# Patient Record
Sex: Female | Born: 1940
Health system: Southern US, Community
[De-identification: ages and names within clinical notes are randomized; demographics above are authoritative.]

## PROBLEM LIST (undated history)

## (undated) DIAGNOSIS — E079 Disorder of thyroid, unspecified: Secondary | ICD-10-CM

## (undated) HISTORY — PX: CATARACT EXTRACTION, BILATERAL: SHX1313

---

## 2001-12-24 ENCOUNTER — Other Ambulatory Visit: Admission: RE | Admit: 2001-12-24 | Discharge: 2001-12-24 | Payer: Self-pay | Admitting: Obstetrics and Gynecology

## 2003-01-07 ENCOUNTER — Encounter: Payer: Self-pay | Admitting: Emergency Medicine

## 2003-01-07 ENCOUNTER — Emergency Department (HOSPITAL_COMMUNITY): Admission: AC | Admit: 2003-01-07 | Discharge: 2003-01-07 | Payer: Self-pay

## 2005-08-11 ENCOUNTER — Encounter: Admission: RE | Admit: 2005-08-11 | Discharge: 2005-08-11 | Payer: Self-pay | Admitting: Neurology

## 2005-12-18 ENCOUNTER — Ambulatory Visit (HOSPITAL_COMMUNITY): Admission: RE | Admit: 2005-12-18 | Discharge: 2005-12-18 | Payer: Self-pay | Admitting: Emergency Medicine

## 2006-01-23 ENCOUNTER — Ambulatory Visit (HOSPITAL_COMMUNITY): Admission: RE | Admit: 2006-01-23 | Discharge: 2006-01-23 | Payer: Self-pay | Admitting: Neurology

## 2009-04-26 ENCOUNTER — Encounter: Admission: RE | Admit: 2009-04-26 | Discharge: 2009-04-26 | Payer: Self-pay | Admitting: Endocrinology

## 2011-07-31 ENCOUNTER — Other Ambulatory Visit (HOSPITAL_COMMUNITY): Payer: Self-pay | Admitting: Endocrinology

## 2011-07-31 DIAGNOSIS — Z1231 Encounter for screening mammogram for malignant neoplasm of breast: Secondary | ICD-10-CM

## 2011-08-28 ENCOUNTER — Ambulatory Visit (HOSPITAL_COMMUNITY)
Admission: RE | Admit: 2011-08-28 | Discharge: 2011-08-28 | Disposition: A | Discharge status: Home / Self Care | Payer: Medicare Other | Source: Ambulatory Visit | Attending: Endocrinology | Admitting: Endocrinology

## 2011-08-28 DIAGNOSIS — Z1231 Encounter for screening mammogram for malignant neoplasm of breast: Secondary | ICD-10-CM | POA: Insufficient documentation

## 2013-12-01 ENCOUNTER — Ambulatory Visit: Payer: Medicare Other | Admitting: Rehabilitative and Restorative Service Providers"

## 2013-12-01 ENCOUNTER — Ambulatory Visit: Payer: Medicare PPO | Attending: Otolaryngology | Admitting: Physical Therapy

## 2013-12-01 DIAGNOSIS — IMO0001 Reserved for inherently not codable concepts without codable children: Secondary | ICD-10-CM | POA: Insufficient documentation

## 2013-12-01 DIAGNOSIS — R269 Unspecified abnormalities of gait and mobility: Secondary | ICD-10-CM | POA: Insufficient documentation

## 2013-12-01 DIAGNOSIS — H8110 Benign paroxysmal vertigo, unspecified ear: Secondary | ICD-10-CM | POA: Diagnosis not present

## 2014-08-04 ENCOUNTER — Emergency Department (HOSPITAL_COMMUNITY)
Admission: EM | Admit: 2014-08-04 | Discharge: 2014-08-04 | Disposition: A | Discharge status: Home / Self Care | Payer: Medicare PPO | Attending: Emergency Medicine | Admitting: Emergency Medicine

## 2014-08-04 ENCOUNTER — Encounter (HOSPITAL_COMMUNITY): Payer: Self-pay | Admitting: Physical Medicine and Rehabilitation

## 2014-08-04 ENCOUNTER — Emergency Department (HOSPITAL_COMMUNITY): Payer: Medicare PPO

## 2014-08-04 DIAGNOSIS — S199XXA Unspecified injury of neck, initial encounter: Secondary | ICD-10-CM | POA: Insufficient documentation

## 2014-08-04 DIAGNOSIS — Y9301 Activity, walking, marching and hiking: Secondary | ICD-10-CM | POA: Insufficient documentation

## 2014-08-04 DIAGNOSIS — Y9289 Other specified places as the place of occurrence of the external cause: Secondary | ICD-10-CM | POA: Diagnosis not present

## 2014-08-04 DIAGNOSIS — Y998 Other external cause status: Secondary | ICD-10-CM | POA: Diagnosis not present

## 2014-08-04 DIAGNOSIS — S59901A Unspecified injury of right elbow, initial encounter: Secondary | ICD-10-CM | POA: Diagnosis present

## 2014-08-04 DIAGNOSIS — M25561 Pain in right knee: Secondary | ICD-10-CM

## 2014-08-04 DIAGNOSIS — W1839XA Other fall on same level, initial encounter: Secondary | ICD-10-CM | POA: Diagnosis not present

## 2014-08-04 DIAGNOSIS — S80211A Abrasion, right knee, initial encounter: Secondary | ICD-10-CM | POA: Insufficient documentation

## 2014-08-04 DIAGNOSIS — Z23 Encounter for immunization: Secondary | ICD-10-CM | POA: Insufficient documentation

## 2014-08-04 DIAGNOSIS — W19XXXA Unspecified fall, initial encounter: Secondary | ICD-10-CM

## 2014-08-04 DIAGNOSIS — S50311A Abrasion of right elbow, initial encounter: Secondary | ICD-10-CM | POA: Insufficient documentation

## 2014-08-04 DIAGNOSIS — M25521 Pain in right elbow: Secondary | ICD-10-CM

## 2014-08-04 MED ORDER — ASPIRIN 81 MG PO CHEW
324.0000 mg | CHEWABLE_TABLET | Freq: Once | ORAL | Status: AC
  Administered 2014-08-04: 324 mg via ORAL
  Filled 2014-08-04: qty 4

## 2014-08-04 MED ORDER — TETANUS-DIPHTH-ACELL PERTUSSIS 5-2.5-18.5 LF-MCG/0.5 IM SUSP
0.5000 mL | Freq: Once | INTRAMUSCULAR | Status: AC
  Administered 2014-08-04: 0.5 mL via INTRAMUSCULAR
  Filled 2014-08-04: qty 0.5

## 2014-08-04 NOTE — Discharge Instructions (Signed)
Dressing Change °A dressing is a material placed over wounds. It keeps the wound clean, dry, and protected from further injury. This provides an environment that favors wound healing.  °BEFORE YOU BEGIN °· Get your supplies together. Things you may need include: °· Saline solution. °· Flexible gauze dressing. °· Medicated cream. °· Tape. °· Gloves. °· Abdominal dressing pads. °· Gauze squares. °· Plastic bags. °· Take pain medicine 30 minutes before the dressing change if you need it. °· Take a shower before you do the first dressing change of the day. Use plastic wrap or a plastic bag to prevent the dressing from getting wet. °REMOVING YOUR OLD DRESSING  °· Wash your hands with soap and water. Dry your hands with a clean towel. °· Put on your gloves. °· Remove any tape. °· Carefully remove the old dressing. If the dressing sticks, you may dampen it with warm water to loosen it, or follow your caregiver's specific directions. °· Remove any gauze or packing tape that is in your wound. °· Take off your gloves. °· Put the gloves, tape, gauze, or any packing tape into a plastic bag. °CHANGING YOUR DRESSING °· Open the supplies. °· Take the cap off the saline solution. °· Open the gauze package so that the gauze remains on the inside of the package. °· Put on your gloves. °· Clean your wound as told by your caregiver. °· If you have been told to keep your wound dry, follow those instructions. °· Your caregiver may tell you to do one or more of the following: °· Pick up the gauze. Pour the saline solution over the gauze. Squeeze out the extra saline solution. °· Put medicated cream or other medicine on your wound if you have been told to do so. °· Put the solution soaked gauze only in your wound, not on the skin around it. °· Pack your wound loosely or as told by your caregiver. °· Put dry gauze on your wound. °· Put abdominal dressing pads over the dry gauze if your wet gauze soaks through. °· Tape the abdominal dressing  pads in place so they will not fall off. Do not wrap the tape completely around the affected part (arm, leg, abdomen). °· Wrap the dressing pads with a flexible gauze dressing to secure it in place. °· Take off your gloves. Put them in the plastic bag with the old dressing. Tie the bag shut and throw it away. °· Keep the dressing clean and dry until your next dressing change. °· Wash your hands. °SEEK MEDICAL CARE IF: °· Your skin around the wound looks red. °· Your wound feels more tender or sore. °· You see pus in the wound. °· Your wound smells bad. °· You have a fever. °· Your skin around the wound has a rash that itches and burns. °· You see black or yellow skin in your wound that was not there before. °· You feel nauseous, throw up, and feel very tired. °Document Released: 07/31/2004 Document Revised: 09/15/2011 Document Reviewed: 05/05/2011 °ExitCare® Patient Information ©2015 [employer]. This information is not intended to replace advice given to you by your health care provider. Make sure you discuss any questions you have with your health care provider. ° ° °Fall Prevention and Home Safety °Falls cause injuries and can affect all age groups. It is possible to use preventive measures to significantly decrease the likelihood of falls. There are many simple measures which can make your home safer and prevent falls. °OUTDOORS °·   Repair cracks and edges of walkways and driveways. °· Remove high doorway thresholds. °· Trim shrubbery on the main path into your home. °· Have good outside lighting. °· Clear walkways of tools, rocks, debris, and clutter. °· Check that handrails are not broken and are securely fastened. Both sides of steps should have handrails. °· Have leaves, snow, and ice cleared regularly. °· Use sand or salt on walkways during winter months. °· In the garage, clean up grease or oil spills. °BATHROOM °· Install night lights. °· Install grab bars by the toilet and in the tub and shower. °· Use  non-skid mats or decals in the tub or shower. °· Place a plastic non-slip stool in the shower to sit on, if needed. °· Keep floors dry and clean up all water on the floor immediately. °· Remove soap buildup in the tub or shower on a regular basis. °· Secure bath mats with non-slip, double-sided rug tape. °· Remove throw rugs and tripping hazards from the floors. °BEDROOMS °· Install night lights. °· Make sure a bedside light is easy to reach. °· Do not use oversized bedding. °· Keep a telephone by your bedside. °· Have a firm chair with side arms to use for getting dressed. °· Remove throw rugs and tripping hazards from the floor. °KITCHEN °· Keep handles on pots and pans turned toward the center of the stove. Use back burners when possible. °· Clean up spills quickly and allow time for drying. °· Avoid walking on wet floors. °· Avoid hot utensils and knives. °· Position shelves so they are not too high or low. °· Place commonly used objects within easy reach. °· If necessary, use a sturdy step stool with a grab bar when reaching. °· Keep electrical cables out of the way. °· Do not use floor polish or wax that makes floors slippery. If you must use wax, use non-skid floor wax. °· Remove throw rugs and tripping hazards from the floor. °STAIRWAYS °· Never leave objects on stairs. °· Place handrails on both sides of stairways and use them. Fix any loose handrails. Make sure handrails on both sides of the stairways are as long as the stairs. °· Check carpeting to make sure it is firmly attached along stairs. Make repairs to worn or loose carpet promptly. °· Avoid placing throw rugs at the top or bottom of stairways, or properly secure the rug with carpet tape to prevent slippage. Get rid of throw rugs, if possible. °· Have an electrician put in a light switch at the top and bottom of the stairs. °OTHER FALL PREVENTION TIPS °· Wear low-heel or rubber-soled shoes that are supportive and fit well. Wear closed toe  shoes. °· When using a stepladder, make sure it is fully opened and both spreaders are firmly locked. Do not climb a closed stepladder. °· Add color or contrast paint or tape to grab bars and handrails in your home. Place contrasting color strips on first and last steps. °· Learn and use mobility aids as needed. Install an electrical emergency response system. °· Turn on lights to avoid dark areas. Replace light bulbs that burn out immediately. Get light switches that glow. °· Arrange furniture to create clear pathways. Keep furniture in the same place. °· Firmly attach carpet with non-skid or double-sided tape. °· Eliminate uneven floor surfaces. °· Select a carpet pattern that does not visually hide the edge of steps. °· Be aware of all pets. °OTHER HOME SAFETY TIPS °· Set the water temperature   for 120° F (48.8° C). °· Keep emergency numbers on or near the telephone. °· Keep smoke detectors on every level of the home and near sleeping areas. °Document Released: 06/13/2002 Document Revised: 12/23/2011 Document Reviewed: 09/12/2011 °ExitCare® Patient Information ©2015 [employer]. This information is not intended to replace advice given to you by your health care provider. Make sure you discuss any questions you have with your health care provider. ° ° °

## 2014-08-04 NOTE — ED Notes (Signed)
Pt reports a fall this morning while walking her dog from the dog pulling her. Pt c/o right neck pain, right elbow, and right leg pain with abrasions on legs. Pt alert and oriented.

## 2014-08-04 NOTE — ED Notes (Signed)
Wait time given 

## 2014-08-04 NOTE — ED Provider Notes (Signed)
  Face-to-face evaluation   History: She fell earlier today when her dog pulled her over as it ran away from her.  She was able to ambulate afterwards.  She complains of pain in neck, right elbow, and frequently.  Physical exam: Alert, elderly female.  No range of motion, arms and legs bilaterally.  She is able sit on the side of the stretcher without problems.  Abrasions of right dorsal elbow, and right anterior knee.  Elbow abrasion is full-thickness for a very small area.  Medical screening examination/treatment/procedure(s) were conducted as a shared visit with non-physician practitioner(s) and myself.  I personally evaluated the patient during the encounter  Flint MelterElliott L Meilyn Heindl, MD 08/05/14 1208

## 2014-08-04 NOTE — ED Notes (Signed)
Pt states she accidentally tripped and fell over dog this morning. Reports R elbow pain/swelling, also states R knee pain. Abrasions noted to R elbow and knee. Denies LOC. Did not hit head. Pt is alert and oriented x4.

## 2014-08-04 NOTE — ED Provider Notes (Signed)
CSN: [id number]     Arrival date & time 08/04/14  1053 History   First MD Initiated Contact with Patient 08/04/14 1522     Chief Complaint  Patient presents with  . Fall   Wanda Long is a 74 y.o. female who presents to the ED after a fall while walking her dog injuring her right elbow and right shoulder. The patient reports her large dog started running away from her and she ran after it, and tripped and fell. She denies hitting her head or loss of consciousness. She reports immediate onset of right knee and right elbow pain. Patient also reports since arrival to the ED she's had gradual onset of right neck pain and low back pain. Patient currently reports her pain as a 6 out of 10 and worse with movement. Patient has taken nothing for treatment today. The patient denies fevers, numbness, tingling, weakness, dizziness, lightheadedness, weakness, chest pain, shortness of breath, or loss of consciousness.   (Consider location/radiation/quality/duration/timing/severity/associated sxs/prior Treatment) HPI  History reviewed. No pertinent past medical history. History reviewed. No pertinent past surgical history. No family history on file. History  Substance Use Topics  . Smoking status: Never Smoker   . Smokeless tobacco: Not on file  . Alcohol Use: No   OB History    No data available     Review of Systems  Constitutional: Negative for fever and chills.  HENT: Negative for congestion, ear pain and sore throat.   Eyes: Negative for pain and visual disturbance.  Respiratory: Negative for cough, shortness of breath and wheezing.   Cardiovascular: Negative for chest pain.  Gastrointestinal: Negative for nausea, vomiting, abdominal pain and diarrhea.  Genitourinary: Negative for dysuria.  Musculoskeletal: Positive for back pain and neck pain.       Right elbow and right knee pain  Skin: Positive for wound. Negative for rash.  Neurological: Negative for dizziness, weakness,  light-headedness and headaches.      Allergies  Review of patient's allergies indicates no known allergies.  Home Medications   Prior to Admission medications   Not on File   BP 105/88 mmHg  Pulse 67  Temp(Src) 98.1 F (36.7 C) (Oral)  Resp 16  SpO2 100% Physical Exam  Constitutional: She is oriented to person, place, and time. She appears well-developed and well-nourished. No distress.  HENT:  Head: Normocephalic and atraumatic.  Mouth/Throat: Oropharynx is clear and moist.  Eyes: Conjunctivae are normal. Pupils are equal, round, and reactive to light. Right eye exhibits no discharge. Left eye exhibits no discharge.  Neck: Normal range of motion. Neck supple.  Neck is non-tender to palpation. No apneas, step-offs or edema noted. Patient able to turn her head greater than 45 in each direction.  Cardiovascular: Normal rate, regular rhythm, normal heart sounds and intact distal pulses.  Exam reveals no gallop and no friction rub.   No murmur heard. Bilateral radial pulses are intact. Bilateral posterior tibialis pulses are intact.  Pulmonary/Chest: Effort normal and breath sounds normal. No respiratory distress. She has no wheezes. She has no rales.  Abdominal: Soft. She exhibits no distension. There is no tenderness.  Musculoskeletal:  Patient has moderate soft tissue swelling to her right anterior knee. There is superficial abrasions. Patient for range of motion of her right knee. Superficial abrasions to her right elbow. Patient is range of motion her right elbow and shoulder. No back tenderness to palpation. No bony tenderness, no crepitus, step-offs or deformities noted.  Lymphadenopathy:  She has no cervical adenopathy.  Neurological: She is alert and oriented to person, place, and time. Coordination normal.  Skin: Skin is warm and dry. No rash noted. She is not diaphoretic. No erythema. No pallor.  Superficial abrasions to her right anterior knee and right elbow. Right  elbow abrasion is full thickness for a very small area.   Psychiatric: She has a normal mood and affect. Her behavior is normal.  Nursing note and vitals reviewed.   ED Course  Procedures (including critical care time) Labs Review Labs Reviewed - No data to display  Imaging Review Dg Elbow Complete Right  08/04/2014   CLINICAL DATA:  Fall walking the dog, right elbow pain  EXAM: RIGHT ELBOW - COMPLETE 3+ VIEW  COMPARISON:  None.  FINDINGS: Four views of the right elbow submitted. No acute fracture or subluxation. No posterior fat pad sign.  IMPRESSION: Negative.   Electronically Signed   By: Natasha MeadLiviu  Pop M.D.   On: 08/04/2014 13:02   Dg Knee Complete 4 Views Right  08/04/2014   CLINICAL DATA:  Fall walking her dog, right knee pain  EXAM: RIGHT KNEE - COMPLETE 4+ VIEW  COMPARISON:  None.  FINDINGS: Four views of the right knee submitted. No acute fracture or subluxation. Soft tissue swelling noted infrapatellar region. No joint effusion.  IMPRESSION: No acute fracture or subluxation. Soft tissue swelling infrapatellar region.   Electronically Signed   By: Natasha MeadLiviu  Pop M.D.   On: 08/04/2014 13:01     EKG Interpretation None      Filed Vitals:   08/04/14 1645 08/04/14 1700 08/04/14 1730 08/04/14 1818  BP: 107/64 102/53 105/88 105/88  Pulse: 74 72 68 67  Temp:    98.1 F (36.7 C)  TempSrc:    Oral  Resp:    16  SpO2: 99% 99% 97% 100%     MDM   Meds given in ED:  Medications  aspirin chewable tablet 324 mg (324 mg Oral Given 08/04/14 1724)  Tdap (BOOSTRIX) injection 0.5 mL (0.5 mLs Intramuscular Given 08/04/14 1724)    New Prescriptions   No medications on file    Final diagnoses:  Right elbow pain  Right knee pain  Fall, initial encounter   This is a 74 year old female who presented to the emergency department after a fall while chasing after her dog. Patient has pain to her right knee and right elbow with abrasions. Patient also complains of gradual onset of low back pain  and right neck pain. Patient denies hitting her head or loss of consciousness. The patient is neurologically intact. Wounds thoroughly cleaned. No wounds needed repair with sutures. Wound care education provided. Patient requests ASA for pain control. Tdap given. X-rays of right elbow and knee are negative. I advised the patient to follow-up with their primary care provider this week. I advised the patient to return to the emergency department with new or worsening symptoms or new concerns. The patient verbalized understanding and agreement with plan.    This patient was seen and evaluated by Dr. Effie ShyWentz who agrees with assessment and plan.     Lawana ChambersWilliam Duncan Zori Benbrook, PA-C 08/04/14 1830  Flint MelterElliott L Wentz, MD 08/05/14 [phone]

## 2014-08-04 NOTE — ED Notes (Signed)
Pt verbalized understanding of discharge instructions and dressing changes/wound care. Pt has no further questions.

## 2014-11-17 ENCOUNTER — Other Ambulatory Visit (HOSPITAL_COMMUNITY): Payer: Self-pay | Admitting: Geriatric Medicine

## 2014-11-17 DIAGNOSIS — Z1231 Encounter for screening mammogram for malignant neoplasm of breast: Secondary | ICD-10-CM

## 2014-11-21 ENCOUNTER — Ambulatory Visit (HOSPITAL_COMMUNITY)
Admission: RE | Admit: 2014-11-21 | Discharge: 2014-11-21 | Disposition: A | Discharge status: Home / Self Care | Payer: Medicare PPO | Source: Ambulatory Visit | Attending: Geriatric Medicine | Admitting: Geriatric Medicine

## 2014-11-21 ENCOUNTER — Other Ambulatory Visit: Payer: Self-pay | Admitting: Endocrinology

## 2014-11-21 DIAGNOSIS — N644 Mastodynia: Secondary | ICD-10-CM

## 2014-11-21 DIAGNOSIS — Z1231 Encounter for screening mammogram for malignant neoplasm of breast: Secondary | ICD-10-CM

## 2014-12-05 ENCOUNTER — Ambulatory Visit
Admission: RE | Admit: 2014-12-05 | Discharge: 2014-12-05 | Disposition: A | Discharge status: Home / Self Care | Payer: Medicare PPO | Source: Ambulatory Visit | Attending: Endocrinology | Admitting: Endocrinology

## 2014-12-05 DIAGNOSIS — N644 Mastodynia: Secondary | ICD-10-CM

## 2014-12-27 ENCOUNTER — Other Ambulatory Visit: Payer: Self-pay | Admitting: Geriatric Medicine

## 2014-12-27 DIAGNOSIS — R911 Solitary pulmonary nodule: Secondary | ICD-10-CM

## 2015-01-10 ENCOUNTER — Inpatient Hospital Stay
Admission: RE | Admit: 2015-01-10 | Discharge: 2015-01-10 | Disposition: A | Discharge status: Home / Self Care | Payer: Self-pay | Source: Ambulatory Visit | Attending: Geriatric Medicine | Admitting: Geriatric Medicine

## 2015-01-10 ENCOUNTER — Ambulatory Visit
Admission: RE | Admit: 2015-01-10 | Discharge: 2015-01-10 | Disposition: A | Discharge status: Home / Self Care | Payer: Medicare PPO | Source: Ambulatory Visit | Attending: Geriatric Medicine | Admitting: Geriatric Medicine

## 2015-01-10 ENCOUNTER — Other Ambulatory Visit: Payer: Self-pay | Admitting: Geriatric Medicine

## 2015-01-10 DIAGNOSIS — R918 Other nonspecific abnormal finding of lung field: Secondary | ICD-10-CM

## 2015-01-10 DIAGNOSIS — R911 Solitary pulmonary nodule: Secondary | ICD-10-CM

## 2015-01-10 MED ORDER — IOPAMIDOL (ISOVUE-300) INJECTION 61%
75.0000 mL | Freq: Once | INTRAVENOUS | Status: AC | PRN
  Administered 2015-01-10: 75 mL via INTRAVENOUS

## 2019-08-03 ENCOUNTER — Other Ambulatory Visit: Payer: Self-pay | Admitting: Geriatric Medicine

## 2019-08-03 DIAGNOSIS — Z1231 Encounter for screening mammogram for malignant neoplasm of breast: Secondary | ICD-10-CM

## 2019-08-05 ENCOUNTER — Other Ambulatory Visit: Payer: Self-pay

## 2019-08-05 ENCOUNTER — Ambulatory Visit
Admission: RE | Admit: 2019-08-05 | Discharge: 2019-08-05 | Disposition: A | Discharge status: Home / Self Care | Payer: Medicare PPO | Source: Ambulatory Visit | Attending: Geriatric Medicine | Admitting: Geriatric Medicine

## 2019-08-05 DIAGNOSIS — Z1231 Encounter for screening mammogram for malignant neoplasm of breast: Secondary | ICD-10-CM

## 2019-09-01 ENCOUNTER — Ambulatory Visit: Payer: Medicare PPO | Attending: Internal Medicine

## 2019-09-01 ENCOUNTER — Ambulatory Visit: Payer: Medicare PPO

## 2019-09-01 DIAGNOSIS — Z23 Encounter for immunization: Secondary | ICD-10-CM | POA: Insufficient documentation

## 2019-09-01 NOTE — Progress Notes (Signed)
   Covid-19 Vaccination Clinic  Name:  Wanda Long    MRN: [id number] DOB: 09-14-1940  09/01/2019  [name] was observed post Covid-19 immunization for 15 minutes without incidence. She was provided with Vaccine Information Sheet and instruction to access the V-Safe system.   [name] was instructed to call 911 with any severe reactions post vaccine: Marland Kitchen Difficulty breathing  . Swelling of your face and throat  . A fast heartbeat  . A bad rash all over your body  . Dizziness and weakness    Immunizations Administered    Name Date Dose VIS Date Route   Pfizer COVID-19 Vaccine 09/01/2019 12:29 PM 0.3 mL 06/17/2019 Intramuscular   Manufacturer: [employer], [employer]   Lot: [id number]   NDC: [address]-4

## 2019-09-12 DIAGNOSIS — H43813 Vitreous degeneration, bilateral: Secondary | ICD-10-CM | POA: Diagnosis not present

## 2019-09-12 DIAGNOSIS — H35373 Puckering of macula, bilateral: Secondary | ICD-10-CM | POA: Diagnosis not present

## 2019-09-12 DIAGNOSIS — H353112 Nonexudative age-related macular degeneration, right eye, intermediate dry stage: Secondary | ICD-10-CM | POA: Diagnosis not present

## 2019-09-12 DIAGNOSIS — H353221 Exudative age-related macular degeneration, left eye, with active choroidal neovascularization: Secondary | ICD-10-CM | POA: Diagnosis not present

## 2019-09-21 ENCOUNTER — Ambulatory Visit: Payer: Medicare PPO | Attending: Internal Medicine

## 2019-09-21 DIAGNOSIS — Z23 Encounter for immunization: Secondary | ICD-10-CM

## 2019-09-21 NOTE — Progress Notes (Signed)
   Covid-19 Vaccination Clinic  Name:  Wanda Long    MRN: [id number] DOB: [birth year]  09/21/2019  [name] was observed post Covid-19 immunization for 15 minutes without incident. She was provided with Vaccine Information Sheet and instruction to access the V-Safe system.   [name] was instructed to call 911 with any severe reactions post vaccine: Marland Kitchen Difficulty breathing  . Swelling of face and throat  . A fast heartbeat  . A bad rash all over body  . Dizziness and weakness   Immunizations Administered    Name Date Dose VIS Date Route   Pfizer COVID-19 Vaccine 09/21/2019  1:48 PM 0.3 mL 06/17/2019 Intramuscular   Manufacturer: [employer], [employer]   Lot: [id number]   NDC: [address]-5

## 2019-11-16 DIAGNOSIS — E559 Vitamin D deficiency, unspecified: Secondary | ICD-10-CM | POA: Diagnosis not present

## 2019-11-16 DIAGNOSIS — E890 Postprocedural hypothyroidism: Secondary | ICD-10-CM | POA: Diagnosis not present

## 2019-11-16 DIAGNOSIS — M899 Disorder of bone, unspecified: Secondary | ICD-10-CM | POA: Diagnosis not present

## 2019-12-01 DIAGNOSIS — M899 Disorder of bone, unspecified: Secondary | ICD-10-CM | POA: Diagnosis not present

## 2019-12-01 DIAGNOSIS — E890 Postprocedural hypothyroidism: Secondary | ICD-10-CM | POA: Diagnosis not present

## 2019-12-01 DIAGNOSIS — E559 Vitamin D deficiency, unspecified: Secondary | ICD-10-CM | POA: Diagnosis not present

## 2019-12-31 ENCOUNTER — Emergency Department (HOSPITAL_COMMUNITY)
Admission: EM | Admit: 2019-12-31 | Discharge: 2020-01-01 | Disposition: A | Discharge status: Home / Self Care | Payer: Medicare PPO | Attending: Emergency Medicine | Admitting: Emergency Medicine

## 2019-12-31 ENCOUNTER — Other Ambulatory Visit: Payer: Self-pay

## 2019-12-31 ENCOUNTER — Encounter (HOSPITAL_COMMUNITY): Payer: Self-pay | Admitting: Emergency Medicine

## 2019-12-31 DIAGNOSIS — K449 Diaphragmatic hernia without obstruction or gangrene: Secondary | ICD-10-CM | POA: Diagnosis not present

## 2019-12-31 DIAGNOSIS — D1809 Hemangioma of other sites: Secondary | ICD-10-CM | POA: Diagnosis not present

## 2019-12-31 DIAGNOSIS — N132 Hydronephrosis with renal and ureteral calculous obstruction: Secondary | ICD-10-CM | POA: Diagnosis not present

## 2019-12-31 DIAGNOSIS — N200 Calculus of kidney: Secondary | ICD-10-CM | POA: Insufficient documentation

## 2019-12-31 DIAGNOSIS — Z7982 Long term (current) use of aspirin: Secondary | ICD-10-CM | POA: Diagnosis not present

## 2019-12-31 DIAGNOSIS — K429 Umbilical hernia without obstruction or gangrene: Secondary | ICD-10-CM | POA: Diagnosis not present

## 2019-12-31 DIAGNOSIS — R1031 Right lower quadrant pain: Secondary | ICD-10-CM | POA: Diagnosis not present

## 2019-12-31 HISTORY — DX: Disorder of thyroid, unspecified: E07.9

## 2019-12-31 LAB — COMPREHENSIVE METABOLIC PANEL
ALT: 16 U/L (ref 0–44)
AST: 20 U/L (ref 15–41)
Albumin: 3.9 g/dL (ref 3.5–5.0)
Alkaline Phosphatase: 71 U/L (ref 38–126)
Anion gap: 12 (ref 5–15)
BUN: 24 mg/dL — ABNORMAL HIGH (ref 8–23)
CO2: 23 mmol/L (ref 22–32)
Calcium: 9.2 mg/dL (ref 8.9–10.3)
Chloride: 107 mmol/L (ref 98–111)
Creatinine, Ser: 1.35 mg/dL — ABNORMAL HIGH (ref 0.44–1.00)
GFR calc Af Amer: 43 mL/min — ABNORMAL LOW (ref >60)
GFR calc non Af Amer: 37 mL/min — ABNORMAL LOW (ref >60)
Glucose, Bld: 114 mg/dL — ABNORMAL HIGH (ref 70–99)
Potassium: 4.1 mmol/L (ref 3.5–5.1)
Sodium: 142 mmol/L (ref 135–145)
Total Bilirubin: 0.7 mg/dL (ref 0.3–1.2)
Total Protein: 6.8 g/dL (ref 6.5–8.1)

## 2019-12-31 LAB — CBC
HCT: 39.9 % (ref 36.0–46.0)
Hemoglobin: 12.4 g/dL (ref 12.0–15.0)
MCH: 29.8 pg (ref 26.0–34.0)
MCHC: 31.1 g/dL (ref 30.0–36.0)
MCV: 95.9 fL (ref 80.0–100.0)
Platelets: 232 10*3/uL (ref 150–400)
RBC: 4.16 MIL/uL (ref 3.87–5.11)
RDW: 14 % (ref 11.5–15.5)
WBC: 7.7 10*3/uL (ref 4.0–10.5)
nRBC: 0 % (ref 0.0–0.2)

## 2019-12-31 LAB — LIPASE, BLOOD: Lipase: 33 U/L (ref 11–51)

## 2019-12-31 MED ORDER — SODIUM CHLORIDE 0.9% FLUSH: 3.0000 mL | Freq: Once | INTRAVENOUS | Status: DC

## 2019-12-31 NOTE — ED Triage Notes (Signed)
Pt sent by UC in [facility] for further evaluation of RLQ pain that started at 2pm today. Denies nausea/vomiting/diarrhea. States the pain comes and goes.

## 2020-01-01 ENCOUNTER — Emergency Department (HOSPITAL_COMMUNITY): Payer: Medicare PPO

## 2020-01-01 ENCOUNTER — Encounter (HOSPITAL_COMMUNITY): Payer: Self-pay | Admitting: Radiology

## 2020-01-01 DIAGNOSIS — K429 Umbilical hernia without obstruction or gangrene: Secondary | ICD-10-CM | POA: Diagnosis not present

## 2020-01-01 DIAGNOSIS — D1809 Hemangioma of other sites: Secondary | ICD-10-CM | POA: Diagnosis not present

## 2020-01-01 DIAGNOSIS — N132 Hydronephrosis with renal and ureteral calculous obstruction: Secondary | ICD-10-CM | POA: Diagnosis not present

## 2020-01-01 DIAGNOSIS — K449 Diaphragmatic hernia without obstruction or gangrene: Secondary | ICD-10-CM | POA: Diagnosis not present

## 2020-01-01 LAB — URINALYSIS, ROUTINE W REFLEX MICROSCOPIC
Bacteria, UA: NONE SEEN
Bilirubin Urine: NEGATIVE
Glucose, UA: NEGATIVE mg/dL
Ketones, ur: 80 mg/dL — AB
Nitrite: NEGATIVE
Protein, ur: NEGATIVE mg/dL
Specific Gravity, Urine: 1.017 (ref 1.005–1.030)
pH: 5 (ref 5.0–8.0)

## 2020-01-01 MED ORDER — SODIUM CHLORIDE 0.9 % IV BOLUS
500.0000 mL | Freq: Once | INTRAVENOUS | Status: AC
  Administered 2020-01-01: 500 mL via INTRAVENOUS

## 2020-01-01 MED ORDER — IOHEXOL 300 MG/ML  SOLN
75.0000 mL | Freq: Once | INTRAMUSCULAR | Status: AC | PRN
  Administered 2020-01-01: 75 mL via INTRAVENOUS

## 2020-01-01 MED ORDER — HYDROCODONE-ACETAMINOPHEN 5-325 MG PO TABS: 0.5000 | ORAL_TABLET | Freq: Four times a day (QID) | ORAL | 0 refills | Status: DC | PRN

## 2020-01-01 MED ORDER — HYDROCODONE-ACETAMINOPHEN 5-325 MG PO TABS
1.0000 | ORAL_TABLET | Freq: Once | ORAL | Status: AC
  Administered 2020-01-01: 1 via ORAL
  Filled 2020-01-01: qty 1

## 2020-01-01 MED ORDER — FENTANYL CITRATE (PF) 100 MCG/2ML IJ SOLN
50.0000 ug | INTRAMUSCULAR | Status: DC | PRN
  Administered 2020-01-01: 50 ug via INTRAVENOUS
  Filled 2020-01-01: qty 2

## 2020-01-01 NOTE — ED Notes (Signed)
Patient transported to CT 

## 2020-01-01 NOTE — ED Notes (Signed)
Pt declined vitals.

## 2020-01-01 NOTE — Discharge Instructions (Signed)
The CAT scan today showed that you had a 3 mm kidney stone on the right side which is the source of your pain.  There was some blood in your urine but no signs of infection.  However if you start having severe pain that is excruciating and you cannot tolerate you need to return to the emergency room or if you start running fever and having vomiting.  The stone should pass on its own hopefully within the next 24 hours.  Make sure you are drinking plenty of fluids.  You can use the pain medication every 6 hours as needed but if the pain is only mild you can also use Tylenol.

## 2020-01-01 NOTE — ED Provider Notes (Signed)
CT scan showed 3 mm stone at the right UVJ.  On reevaluation patient reports her pain is significantly improved.  Patient was given short prescription for pain control to use as needed and follow-up with urology if there are any issues.  Patient is stable for discharge.   Gwyneth Sprout, MD 01/01/20 [phone]

## 2020-01-02 DIAGNOSIS — H353112 Nonexudative age-related macular degeneration, right eye, intermediate dry stage: Secondary | ICD-10-CM | POA: Diagnosis not present

## 2020-01-02 DIAGNOSIS — H43813 Vitreous degeneration, bilateral: Secondary | ICD-10-CM | POA: Diagnosis not present

## 2020-01-02 DIAGNOSIS — H35373 Puckering of macula, bilateral: Secondary | ICD-10-CM | POA: Diagnosis not present

## 2020-01-02 DIAGNOSIS — H353221 Exudative age-related macular degeneration, left eye, with active choroidal neovascularization: Secondary | ICD-10-CM | POA: Diagnosis not present

## 2020-01-06 NOTE — ED Provider Notes (Signed)
[facility] EMERGENCY DEPARTMENT Provider Note   CSN: [id number] Arrival date & time: 12/31/19  1637     History Chief Complaint  Patient presents with   Abdominal Pain    Wanda Long is a 79 y.o. female.  HPI     79 year old female comes in a chief complaint of abdominal pain.  Patient was sent here from an urgent care in [place], with thoughts of getting CT scan to rule out appendicitis.  Patient reports that she started having right lower quadrant pain yesterday afternoon.  She went to the urgent care and was advised to come to the ER because of severity of her pain.  She denies any nausea, vomiting, diarrhea.  Pain is intermittent, waxing and waning but at its peak it is intense.  She denies any history of kidney stones.  Patient has no UTI-like symptoms.  Past Medical History:  Diagnosis Date   Thyroid disease     There are no problems to display for this patient.   History reviewed. No pertinent surgical history.   OB History   No obstetric history on file.     No family history on file.  Social History   Tobacco Use   Smoking status: Never Smoker  Substance Use Topics   Alcohol use: No   Drug use: No    Home Medications Prior to Admission medications   Medication Sig Start Date End Date Taking? Authorizing Provider  aspirin EC 81 MG tablet Take 81 mg by mouth daily as needed for mild pain. Swallow whole.   Yes [provider]  calcium carbonate (CALCIUM 600) 600 MG TABS tablet Take 600 mg by mouth 2 (two) times daily with a meal.   Yes [provider]  Cholecalciferol (VITAMIN D3) 25 MCG (1000 UT) CAPS Take 2,000 Units by mouth daily.   Yes [provider]  Multiple Vitamins-Minerals (PRESERVISION AREDS 2+MULTI VIT PO) Take 1 tablet by mouth in the morning and at bedtime.   Yes [provider]  SYNTHROID 75 MCG tablet Take 75 mcg by mouth every morning. 11/10/19  Yes [provider]  HYDROcodone-acetaminophen (NORCO/VICODIN) 5-325 MG tablet Take 0.5-1 tablets by mouth every 6 (six) hours as needed for severe pain. 01/01/20   Gwyneth Sprout, MD    Allergies    Patient has no known allergies.  Review of Systems   Review of Systems  Constitutional: Positive for activity change.  Respiratory: Negative for shortness of breath.   Cardiovascular: Negative for chest pain.  Gastrointestinal: Positive for abdominal pain. Negative for nausea and vomiting.  Allergic/Immunologic: Negative for immunocompromised state.  All other systems reviewed and are negative.   Physical Exam Updated Vital Signs BP (!) 117/43    Pulse 69    Temp 97.9 F (36.6 C) (Oral)    Resp (!) 22    Ht 5' 7.5" (1.715 m)    Wt 76.2 kg    SpO2 100%    BMI 25.92 kg/m   Physical Exam Vitals and nursing note reviewed.  Constitutional:      Appearance: She is well-developed.  HENT:     Head: Normocephalic and atraumatic.  Eyes:     Pupils: Pupils are equal, round, and reactive to light.  Cardiovascular:     Rate and Rhythm: Normal rate and regular rhythm.     Heart sounds: Normal heart sounds. No murmur heard.   Pulmonary:     Effort: Pulmonary effort is normal. No respiratory  distress.  Abdominal:     General: There is no distension.     Palpations: Abdomen is soft.     Tenderness: There is abdominal tenderness in the right lower quadrant. There is guarding. There is no rebound. Negative signs include Murphy's sign and McBurney's sign.  Musculoskeletal:     Cervical back: Neck supple.  Skin:    General: Skin is warm and dry.  Neurological:     Mental Status: She is alert and oriented to person, place, and time.     ED Results / Procedures / Treatments   Labs (all labs ordered are listed, but only abnormal results are displayed) Labs Reviewed  COMPREHENSIVE METABOLIC PANEL - Abnormal; Notable for the following components:      Result Value   Glucose, Bld 114 (*)     BUN 24 (*)    Creatinine, Ser 1.35 (*)    GFR calc non Af Amer 37 (*)    GFR calc Af Amer 43 (*)    All other components within normal limits  URINALYSIS, ROUTINE W REFLEX MICROSCOPIC - Abnormal; Notable for the following components:   Hgb urine dipstick MODERATE (*)    Ketones, ur 80 (*)    Leukocytes,Ua TRACE (*)    All other components within normal limits  LIPASE, BLOOD  CBC    EKG None  Radiology No results found.  Procedures Procedures (including critical care time)  Medications Ordered in ED Medications  sodium chloride 0.9 % bolus 500 mL (0 mLs Intravenous Stopped 01/01/20 0840)  iohexol (OMNIPAQUE) 300 MG/ML solution 75 mL (75 mLs Intravenous Contrast Given 01/01/20 0804)  HYDROcodone-acetaminophen (NORCO/VICODIN) 5-325 MG per tablet 1 tablet (1 tablet Oral Given 01/01/20 1027)    ED Course  I have reviewed the triage vital signs and the nursing notes.  Pertinent labs & imaging results that were available during my care of the patient were reviewed by me and considered in my medical decision making (see chart for details).    MDM Rules/Calculators/A&P                          79 year old female comes in a chief complaint of right-sided abdominal pain.  During her exam she does not McBurney's, but there is some tenderness over the suprapubic and right lower quadrant.  Pain is nonradiating.  Differential diagnosis includes pyelonephritis, gallstones, kidney stones.  Patient is status post cholecystectomy.  Appendicitis is also in the differential, but less likely given that the pain was sudden onset and waxing and waning.  CT scan ordered and her care has been signed out to incoming team.  Patient is comfortable.  Final Clinical Impression(s) / ED Diagnoses Final diagnoses:  Kidney stone on right side    Rx / DC Orders ED Discharge Orders         Ordered    HYDROcodone-acetaminophen (NORCO/VICODIN) 5-325 MG tablet  Every 6 hours PRN     Discontinue  Reprint      01/01/20 0851           Derwood Kaplan, MD 01/06/20 1559

## 2020-04-19 DIAGNOSIS — H353112 Nonexudative age-related macular degeneration, right eye, intermediate dry stage: Secondary | ICD-10-CM | POA: Diagnosis not present

## 2020-04-19 DIAGNOSIS — H353221 Exudative age-related macular degeneration, left eye, with active choroidal neovascularization: Secondary | ICD-10-CM | POA: Diagnosis not present

## 2020-04-19 DIAGNOSIS — H43813 Vitreous degeneration, bilateral: Secondary | ICD-10-CM | POA: Diagnosis not present

## 2020-04-19 DIAGNOSIS — H35373 Puckering of macula, bilateral: Secondary | ICD-10-CM | POA: Diagnosis not present

## 2020-08-07 ENCOUNTER — Other Ambulatory Visit: Payer: Self-pay | Admitting: Geriatric Medicine

## 2020-08-07 ENCOUNTER — Ambulatory Visit
Admission: RE | Admit: 2020-08-07 | Discharge: 2020-08-07 | Disposition: A | Discharge status: Home / Self Care | Payer: Medicare PPO | Source: Ambulatory Visit | Attending: Geriatric Medicine | Admitting: Geriatric Medicine

## 2020-08-07 DIAGNOSIS — H353221 Exudative age-related macular degeneration, left eye, with active choroidal neovascularization: Secondary | ICD-10-CM | POA: Diagnosis not present

## 2020-08-07 DIAGNOSIS — Z23 Encounter for immunization: Secondary | ICD-10-CM | POA: Diagnosis not present

## 2020-08-07 DIAGNOSIS — Z0000 Encounter for general adult medical examination without abnormal findings: Secondary | ICD-10-CM | POA: Diagnosis not present

## 2020-08-07 DIAGNOSIS — M810 Age-related osteoporosis without current pathological fracture: Secondary | ICD-10-CM | POA: Diagnosis not present

## 2020-08-07 DIAGNOSIS — M542 Cervicalgia: Secondary | ICD-10-CM | POA: Diagnosis not present

## 2020-08-07 DIAGNOSIS — E039 Hypothyroidism, unspecified: Secondary | ICD-10-CM | POA: Diagnosis not present

## 2020-08-07 DIAGNOSIS — Z1389 Encounter for screening for other disorder: Secondary | ICD-10-CM | POA: Diagnosis not present

## 2020-08-07 DIAGNOSIS — Z79899 Other long term (current) drug therapy: Secondary | ICD-10-CM | POA: Diagnosis not present

## 2020-08-23 DIAGNOSIS — H353112 Nonexudative age-related macular degeneration, right eye, intermediate dry stage: Secondary | ICD-10-CM | POA: Diagnosis not present

## 2020-08-23 DIAGNOSIS — H35373 Puckering of macula, bilateral: Secondary | ICD-10-CM | POA: Diagnosis not present

## 2020-08-23 DIAGNOSIS — H43813 Vitreous degeneration, bilateral: Secondary | ICD-10-CM | POA: Diagnosis not present

## 2020-08-23 DIAGNOSIS — H353221 Exudative age-related macular degeneration, left eye, with active choroidal neovascularization: Secondary | ICD-10-CM | POA: Diagnosis not present

## 2020-12-11 DIAGNOSIS — M899 Disorder of bone, unspecified: Secondary | ICD-10-CM | POA: Diagnosis not present

## 2020-12-11 DIAGNOSIS — E559 Vitamin D deficiency, unspecified: Secondary | ICD-10-CM | POA: Diagnosis not present

## 2020-12-11 DIAGNOSIS — E890 Postprocedural hypothyroidism: Secondary | ICD-10-CM | POA: Diagnosis not present

## 2020-12-11 DIAGNOSIS — M8589 Other specified disorders of bone density and structure, multiple sites: Secondary | ICD-10-CM | POA: Diagnosis not present

## 2020-12-18 DIAGNOSIS — M899 Disorder of bone, unspecified: Secondary | ICD-10-CM | POA: Diagnosis not present

## 2020-12-18 DIAGNOSIS — E890 Postprocedural hypothyroidism: Secondary | ICD-10-CM | POA: Diagnosis not present

## 2020-12-18 DIAGNOSIS — E559 Vitamin D deficiency, unspecified: Secondary | ICD-10-CM | POA: Diagnosis not present

## 2020-12-19 DIAGNOSIS — H353112 Nonexudative age-related macular degeneration, right eye, intermediate dry stage: Secondary | ICD-10-CM | POA: Diagnosis not present

## 2020-12-19 DIAGNOSIS — H43813 Vitreous degeneration, bilateral: Secondary | ICD-10-CM | POA: Diagnosis not present

## 2020-12-19 DIAGNOSIS — H35373 Puckering of macula, bilateral: Secondary | ICD-10-CM | POA: Diagnosis not present

## 2020-12-19 DIAGNOSIS — H353221 Exudative age-related macular degeneration, left eye, with active choroidal neovascularization: Secondary | ICD-10-CM | POA: Diagnosis not present

## 2021-02-06 ENCOUNTER — Ambulatory Visit: Payer: Medicare PPO | Admitting: Orthopedic Surgery

## 2021-02-06 ENCOUNTER — Encounter: Payer: Self-pay | Admitting: Orthopedic Surgery

## 2021-02-06 ENCOUNTER — Other Ambulatory Visit: Payer: Self-pay

## 2021-02-06 ENCOUNTER — Ambulatory Visit (INDEPENDENT_AMBULATORY_CARE_PROVIDER_SITE_OTHER): Payer: Medicare PPO

## 2021-02-06 DIAGNOSIS — M533 Sacrococcygeal disorders, not elsewhere classified: Secondary | ICD-10-CM

## 2021-02-06 DIAGNOSIS — M79604 Pain in right leg: Secondary | ICD-10-CM

## 2021-02-06 NOTE — Progress Notes (Signed)
Office Visit Note   Patient: Wanda Long           Date of Birth: 08-10-1940           MRN: [id number] Visit Date: 02/06/2021 Requested by: Merlene Laughter, MD [address] [place],  [place] [address] PCP: Merlene Laughter, MD  Subjective: Chief Complaint  Patient presents with   Right Knee - Pain   Lower Back - Pain    HPI: Wanda Long is a 80 y.o. female who presents to the office complaining of back pain and right knee pain.  Patient states that she has had worsening pain over the last year.  She denies any recent injuries but she does note a fall about 5 years ago where her dog pulled her down while she was walking him.  This does not seem related to her current pain as she recovered well from that injury.  She does report most of her pain is in the "tailbone".  She wakes with pain daily due to the low back and tailbone pain.  Denies any radicular pain down her legs, numbness/tingling.  She has tried therapy exercises that she has used in the past but she is unable to do these exercises without a severe increase in her pain.  She notes spine extension and flexion make pain worse and she has difficulty sitting on her tailbone at times due to the pain.  Most of her pain is in her back rather than her knee.  The knee does not really give out on her though it does cause some medial and anterior pain.  No groin pain.  She has no history of prior surgery to her knee or back.  Denies any mechanical or instability symptoms in her knee.  The knee is a secondary concern compared with her back pain..                ROS: All systems reviewed are negative as they relate to the chief complaint within the history of present illness.  Patient denies fevers or chills.  Assessment & Plan: Visit Diagnoses:  1. Pain in right leg   2. Sacral back pain     Plan: Patient is a 80 year old female who presents complaining of low back pain and right knee pain.  She has worsening pain  over the last year that now causes her to wake up with pain daily.  She uses a topical that helps somewhat but nothing that provides really lasting relief.  She is not able to do any therapy exercises due to severe pain.  Radiographs of the lumbar spine demonstrate moderate degenerative changes throughout the lumbar spine with minimal grade 1 spondylolisthesis at L2-L3.  No fractures noted.  With her main complaint being her back pain, plan to order MRI of the lumbar spine and sacrum for further evaluation.  Follow-up after MRI to review results.  Follow-Up Instructions: No follow-ups on file.   Orders:  Orders Placed This Encounter  Procedures   XR Knee 1-2 Views Right   XR Lumbar Spine 2-3 Views   MR Lumbar Spine w/o contrast   MR SACRUM SI JOINTS WO CONTRAST   No orders of the defined types were placed in this encounter.     Procedures: No procedures performed   Clinical Data: No additional findings.  Objective: Vital Signs: There were no vitals taken for this visit.  Physical Exam:  Constitutional: Patient appears well-developed HEENT:  Head: Normocephalic Eyes:EOM are  normal Neck: Normal range of motion Cardiovascular: Normal rate Pulmonary/chest: Effort normal Neurologic: Patient is alert Skin: Skin is warm Psychiatric: Patient has normal mood and affect  Ortho Exam: Ortho exam demonstrates right knee without effusion.  0 degrees extension and 115 degrees of knee flexion.  Mild tenderness over the medial and lateral joint lines.  Able to perform straight leg raise without difficulty.  No pain with hip range of motion.  5/5 motor strength of bilateral hip flexion, quadricep, hamstring, dorsiflexion, plantarflexion.  Increased patellar tendon reflex on the left lower extremity compared to right.  No evidence of clonus.  Negative straight leg raise bilaterally.  Tenderness throughout the axial lumbar spine as well as through the sacrum.  Pain with passive flexion and  extension of the spine.  Specialty Comments:  No specialty comments available.  Imaging: No results found.   PMFS History: There are no problems to display for this patient.  Past Medical History:  Diagnosis Date   Thyroid disease     No family history on file.  No past surgical history on file. Social History   Occupational History   Not on file  Tobacco Use   Smoking status: Never   Smokeless tobacco: Not on file  Substance and Sexual Activity   Alcohol use: No   Drug use: No   Sexual activity: Not on file

## 2021-02-07 ENCOUNTER — Encounter: Payer: Self-pay | Admitting: Orthopedic Surgery

## 2021-02-17 ENCOUNTER — Ambulatory Visit
Admission: RE | Admit: 2021-02-17 | Discharge: 2021-02-17 | Disposition: A | Discharge status: Home / Self Care | Payer: Medicare PPO | Source: Ambulatory Visit | Attending: Surgical | Admitting: Surgical

## 2021-02-17 ENCOUNTER — Other Ambulatory Visit: Payer: Self-pay

## 2021-02-17 DIAGNOSIS — M533 Sacrococcygeal disorders, not elsewhere classified: Secondary | ICD-10-CM

## 2021-02-17 DIAGNOSIS — M79604 Pain in right leg: Secondary | ICD-10-CM

## 2021-02-17 DIAGNOSIS — M545 Low back pain, unspecified: Secondary | ICD-10-CM | POA: Diagnosis not present

## 2021-02-17 DIAGNOSIS — M48061 Spinal stenosis, lumbar region without neurogenic claudication: Secondary | ICD-10-CM | POA: Diagnosis not present

## 2021-02-25 ENCOUNTER — Encounter: Payer: Self-pay | Admitting: Orthopedic Surgery

## 2021-02-25 ENCOUNTER — Ambulatory Visit: Payer: Medicare PPO | Admitting: Orthopedic Surgery

## 2021-02-25 ENCOUNTER — Other Ambulatory Visit: Payer: Self-pay

## 2021-02-25 DIAGNOSIS — M533 Sacrococcygeal disorders, not elsewhere classified: Secondary | ICD-10-CM | POA: Diagnosis not present

## 2021-02-25 DIAGNOSIS — H353220 Exudative age-related macular degeneration, left eye, stage unspecified: Secondary | ICD-10-CM | POA: Diagnosis not present

## 2021-02-25 DIAGNOSIS — M79604 Pain in right leg: Secondary | ICD-10-CM

## 2021-03-01 ENCOUNTER — Encounter: Payer: Self-pay | Admitting: Orthopedic Surgery

## 2021-03-01 NOTE — Progress Notes (Signed)
   Office Visit Note   Patient: Wanda Long           Date of Birth: 1941-02-19           MRN: [id number] Visit Date: 02/25/2021 Requested by: Merlene Laughter, MD [address] [place],  [place] [address] PCP: Merlene Laughter, MD  Subjective: Chief Complaint  Patient presents with   Lower Back - Follow-up    HPI: Wanda Long is an 80 year old patient with back pain.  She feels like her knee is going to give out.  Denies any falls or injuries.  Reports pain from the sacral and coccyx region down to the knee.  Since she was last seen she has had an MRI of her lumbar spine and MRI of the sacrum performed.  She has no sacral fracture.  No lateralizing findings in the lumbar spine.  Mild degenerative changes present.              ROS: All systems reviewed are negative as they relate to the chief complaint within the history of present illness.  Patient denies  fevers or chills.   Assessment & Plan: Visit Diagnoses:  1. Pain in right leg   2. Sacral back pain     Plan: Impression is low back and sacral pain with normal scans.  In general she does not want an injection.  She has some mild degenerative changes which are actually less and would be expected for someone in her age group.  Knee examination is normal.  Overall whenever soft tissue injury occurred from her encounter with the dog should be self-limited.  No indication for further intervention at this time.  Follow-Up Instructions: Return if symptoms worsen or fail to improve.   Orders:  No orders of the defined types were placed in this encounter.  No orders of the defined types were placed in this encounter.     Procedures: No procedures performed   Clinical Data: No additional findings.  Objective: Vital Signs: There were no vitals taken for this visit.  Physical Exam:   Constitutional: Patient appears well-developed HEENT:  Head: Normocephalic Eyes:EOM are normal Neck: Normal range of  motion Cardiovascular: Normal rate Pulmonary/chest: Effort normal Neurologic: Patient is alert Skin: Skin is warm Psychiatric: Patient has normal mood and affect   Ortho Exam: Ortho exam demonstrates normal gait alignment.  No nerve root tension signs.  Pain localizing to the sacroiliac joint with no trochanteric tenderness.  No muscle atrophy in either leg.  Specialty Comments:  No specialty comments available.  Imaging: No results found.   PMFS History: There are no problems to display for this patient.  Past Medical History:  Diagnosis Date   Thyroid disease     History reviewed. No pertinent family history.  History reviewed. No pertinent surgical history. Social History   Occupational History   Not on file  Tobacco Use   Smoking status: Never   Smokeless tobacco: Not on file  Substance and Sexual Activity   Alcohol use: No   Drug use: No   Sexual activity: Not on file

## 2021-04-11 DIAGNOSIS — H35373 Puckering of macula, bilateral: Secondary | ICD-10-CM | POA: Diagnosis not present

## 2021-04-11 DIAGNOSIS — H353112 Nonexudative age-related macular degeneration, right eye, intermediate dry stage: Secondary | ICD-10-CM | POA: Diagnosis not present

## 2021-04-11 DIAGNOSIS — H353221 Exudative age-related macular degeneration, left eye, with active choroidal neovascularization: Secondary | ICD-10-CM | POA: Diagnosis not present

## 2021-04-11 DIAGNOSIS — H43813 Vitreous degeneration, bilateral: Secondary | ICD-10-CM | POA: Diagnosis not present

## 2021-07-25 DIAGNOSIS — H903 Sensorineural hearing loss, bilateral: Secondary | ICD-10-CM | POA: Diagnosis not present

## 2021-08-01 DIAGNOSIS — H353112 Nonexudative age-related macular degeneration, right eye, intermediate dry stage: Secondary | ICD-10-CM | POA: Diagnosis not present

## 2021-08-01 DIAGNOSIS — H43813 Vitreous degeneration, bilateral: Secondary | ICD-10-CM | POA: Diagnosis not present

## 2021-08-01 DIAGNOSIS — H353221 Exudative age-related macular degeneration, left eye, with active choroidal neovascularization: Secondary | ICD-10-CM | POA: Diagnosis not present

## 2021-08-01 DIAGNOSIS — H35373 Puckering of macula, bilateral: Secondary | ICD-10-CM | POA: Diagnosis not present

## 2021-08-07 ENCOUNTER — Ambulatory Visit
Admission: RE | Admit: 2021-08-07 | Discharge: 2021-08-07 | Disposition: A | Discharge status: Home / Self Care | Payer: Medicare PPO | Source: Ambulatory Visit | Attending: Geriatric Medicine | Admitting: Geriatric Medicine

## 2021-08-07 ENCOUNTER — Other Ambulatory Visit: Payer: Self-pay

## 2021-08-07 ENCOUNTER — Other Ambulatory Visit: Payer: Self-pay | Admitting: Geriatric Medicine

## 2021-08-07 DIAGNOSIS — H353221 Exudative age-related macular degeneration, left eye, with active choroidal neovascularization: Secondary | ICD-10-CM | POA: Diagnosis not present

## 2021-08-07 DIAGNOSIS — Z23 Encounter for immunization: Secondary | ICD-10-CM | POA: Diagnosis not present

## 2021-08-07 DIAGNOSIS — R0789 Other chest pain: Secondary | ICD-10-CM

## 2021-08-07 DIAGNOSIS — E039 Hypothyroidism, unspecified: Secondary | ICD-10-CM | POA: Diagnosis not present

## 2021-08-07 DIAGNOSIS — Z1389 Encounter for screening for other disorder: Secondary | ICD-10-CM | POA: Diagnosis not present

## 2021-08-07 DIAGNOSIS — Z79899 Other long term (current) drug therapy: Secondary | ICD-10-CM | POA: Diagnosis not present

## 2021-08-07 DIAGNOSIS — Z0000 Encounter for general adult medical examination without abnormal findings: Secondary | ICD-10-CM | POA: Diagnosis not present

## 2021-08-26 DIAGNOSIS — M79644 Pain in right finger(s): Secondary | ICD-10-CM | POA: Diagnosis not present

## 2021-08-26 DIAGNOSIS — Z23 Encounter for immunization: Secondary | ICD-10-CM | POA: Diagnosis not present

## 2021-11-21 DIAGNOSIS — H35373 Puckering of macula, bilateral: Secondary | ICD-10-CM | POA: Diagnosis not present

## 2021-11-21 DIAGNOSIS — H353221 Exudative age-related macular degeneration, left eye, with active choroidal neovascularization: Secondary | ICD-10-CM | POA: Diagnosis not present

## 2021-11-21 DIAGNOSIS — H43813 Vitreous degeneration, bilateral: Secondary | ICD-10-CM | POA: Diagnosis not present

## 2021-11-21 DIAGNOSIS — H353112 Nonexudative age-related macular degeneration, right eye, intermediate dry stage: Secondary | ICD-10-CM | POA: Diagnosis not present

## 2021-12-11 DIAGNOSIS — E890 Postprocedural hypothyroidism: Secondary | ICD-10-CM | POA: Diagnosis not present

## 2021-12-11 DIAGNOSIS — E559 Vitamin D deficiency, unspecified: Secondary | ICD-10-CM | POA: Diagnosis not present

## 2021-12-11 DIAGNOSIS — M899 Disorder of bone, unspecified: Secondary | ICD-10-CM | POA: Diagnosis not present

## 2021-12-18 DIAGNOSIS — R238 Other skin changes: Secondary | ICD-10-CM | POA: Diagnosis not present

## 2021-12-18 DIAGNOSIS — M899 Disorder of bone, unspecified: Secondary | ICD-10-CM | POA: Diagnosis not present

## 2021-12-18 DIAGNOSIS — E559 Vitamin D deficiency, unspecified: Secondary | ICD-10-CM | POA: Diagnosis not present

## 2021-12-18 DIAGNOSIS — E890 Postprocedural hypothyroidism: Secondary | ICD-10-CM | POA: Diagnosis not present

## 2022-01-01 DIAGNOSIS — L218 Other seborrheic dermatitis: Secondary | ICD-10-CM | POA: Diagnosis not present

## 2022-01-01 DIAGNOSIS — L820 Inflamed seborrheic keratosis: Secondary | ICD-10-CM | POA: Diagnosis not present

## 2022-02-18 IMAGING — DX DG RIBS W/ CHEST 3+V*L*
5 series · 5 of 5 positions shown · non-contrast
Comparison: None.

CLINICAL DATA: Left-sided chest wall pain, no known injury, initial
encounter

EXAM:
LEFT RIBS AND CHEST - 3+ VIEW

[dg ribs unilateral w/chest left (1 of 5)]
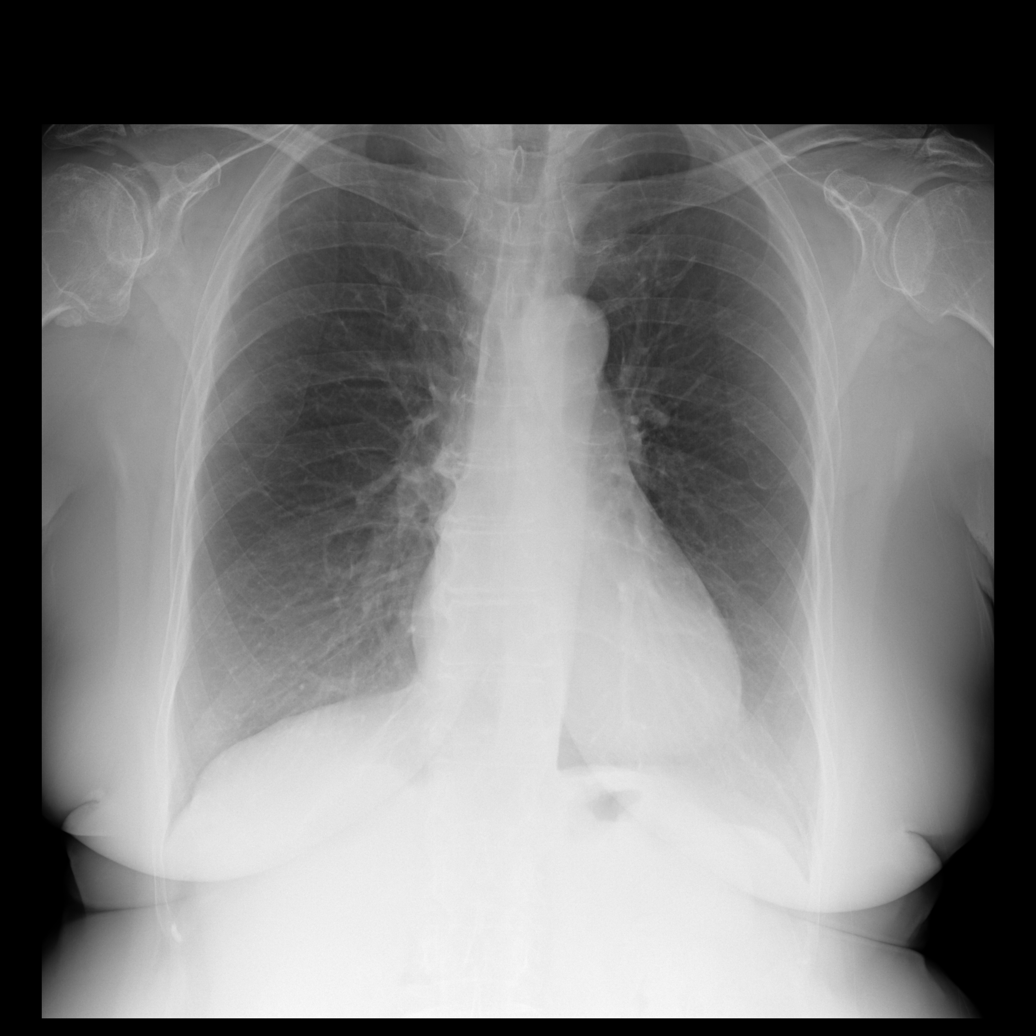

[dg ribs unilateral w/chest left (2 of 5)]
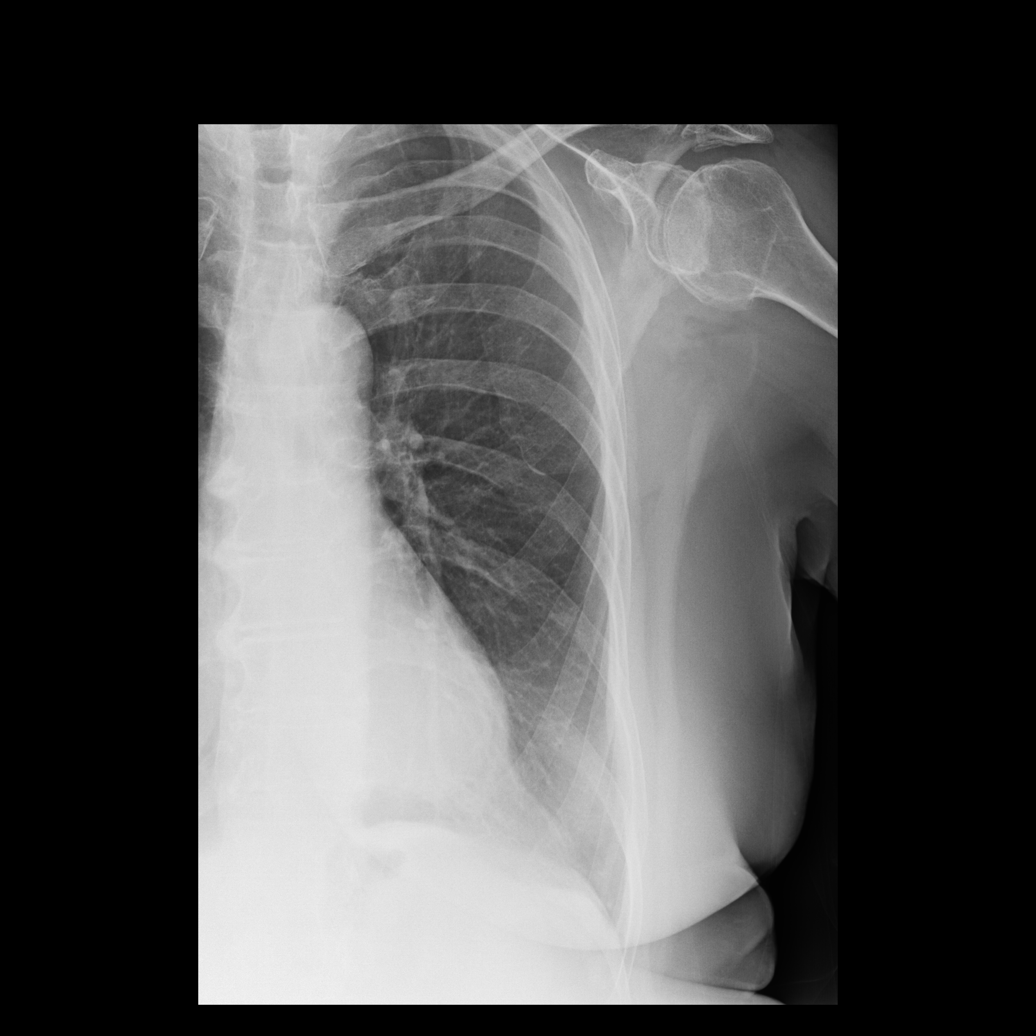

[dg ribs unilateral w/chest left (3 of 5)]
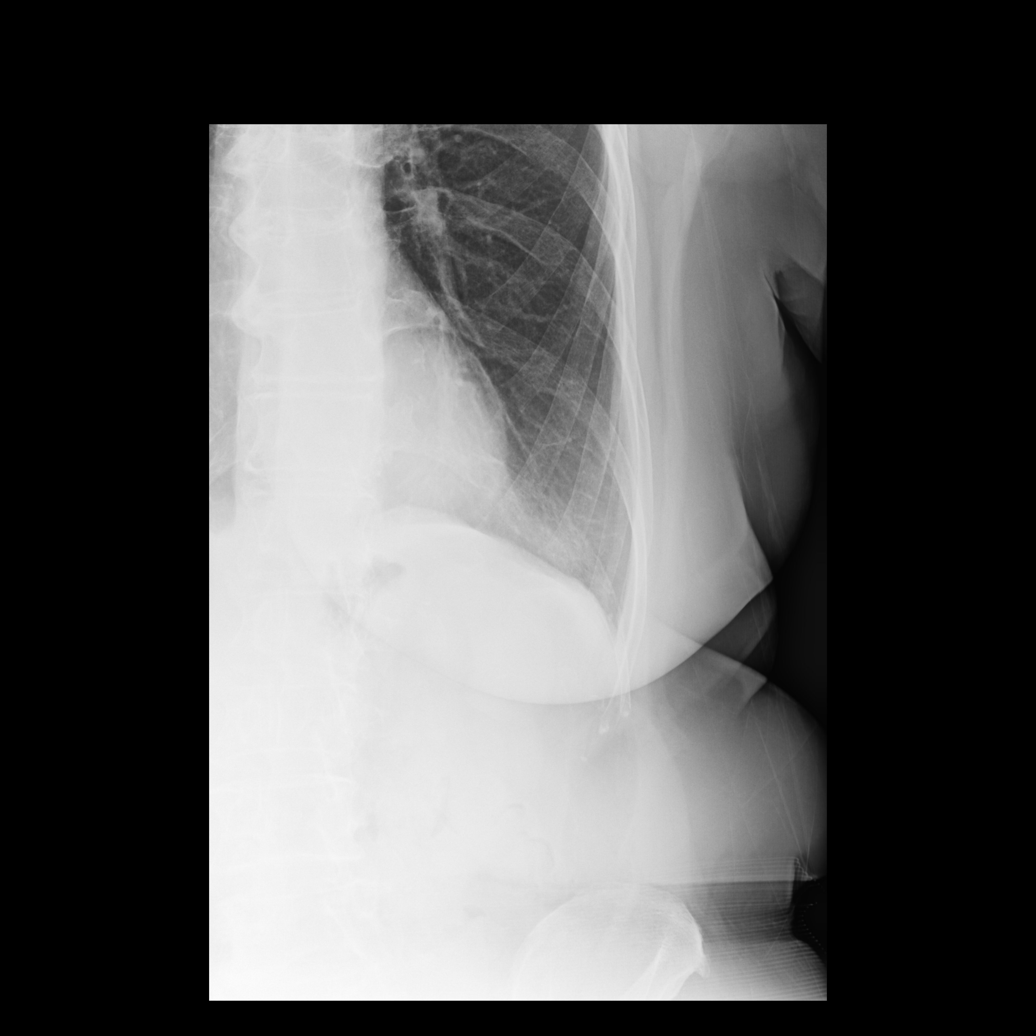

[dg ribs unilateral w/chest left (4 of 5)]
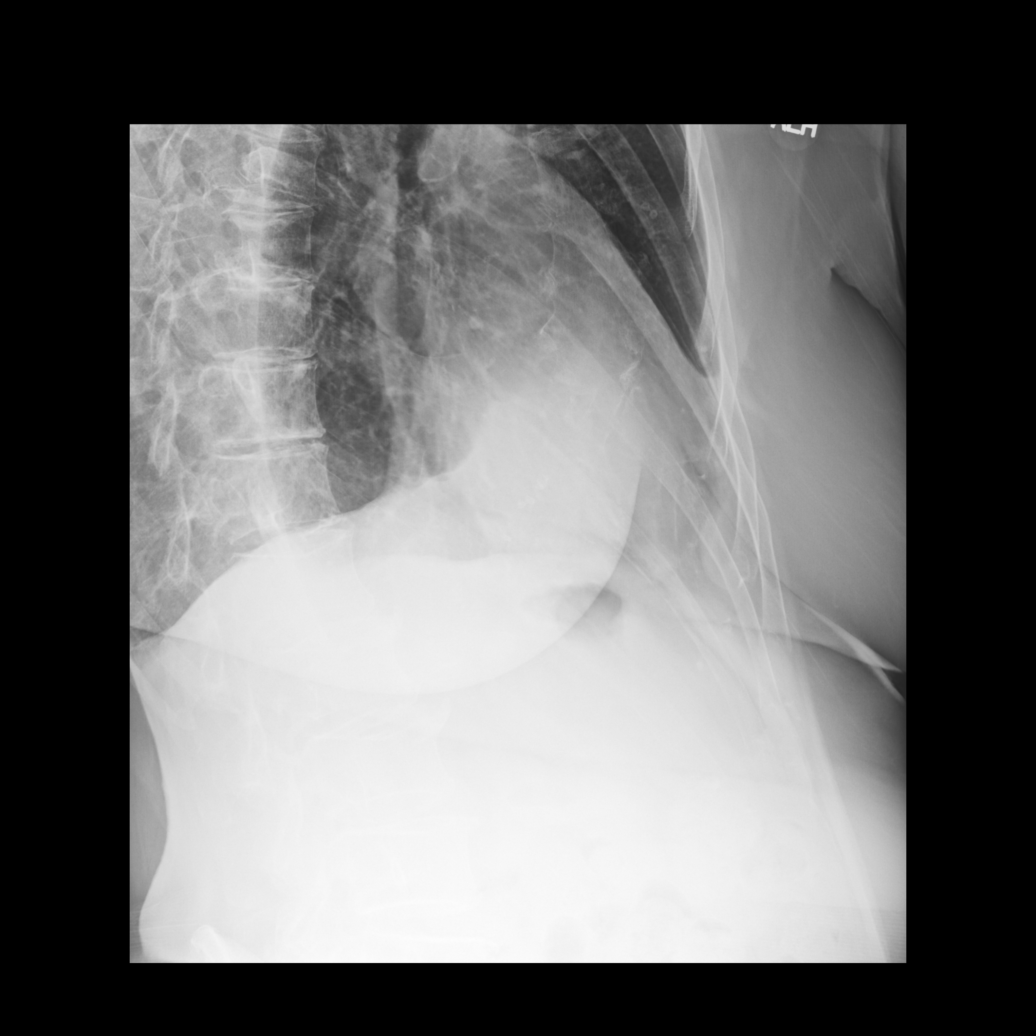

[dg ribs unilateral w/chest left (5 of 5)]
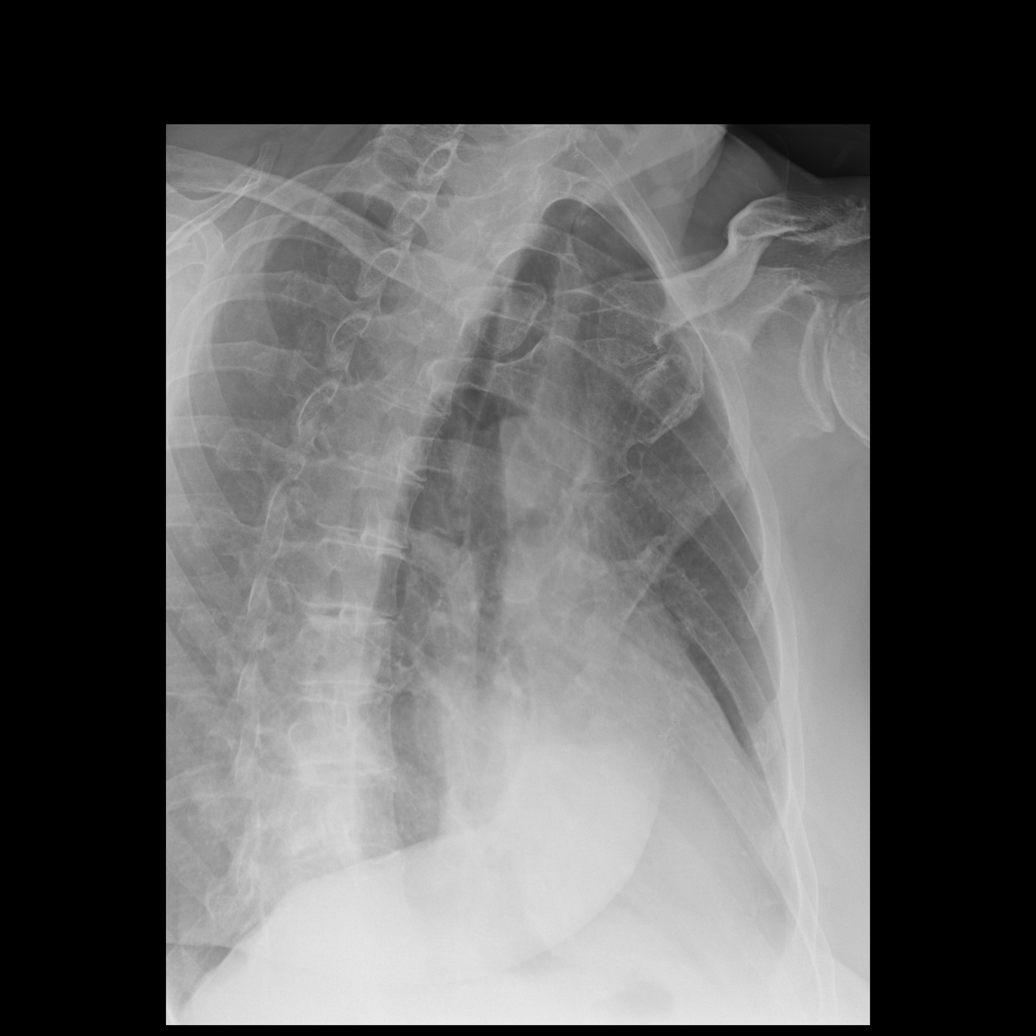

[5 of 5 positions shown; findings below may reference images not displayed]

FINDINGS: Cardiac shadow is within normal limits. Lungs are well aerated
bilaterally. No focal infiltrate or sizable effusion is seen. No
pneumothorax is noted. No acute rib abnormality noted.
IMPRESSION: No acute abnormality in the ribs.

## 2022-03-13 DIAGNOSIS — H353221 Exudative age-related macular degeneration, left eye, with active choroidal neovascularization: Secondary | ICD-10-CM | POA: Diagnosis not present

## 2022-03-13 DIAGNOSIS — H43813 Vitreous degeneration, bilateral: Secondary | ICD-10-CM | POA: Diagnosis not present

## 2022-03-13 DIAGNOSIS — H353112 Nonexudative age-related macular degeneration, right eye, intermediate dry stage: Secondary | ICD-10-CM | POA: Diagnosis not present

## 2022-03-13 DIAGNOSIS — H35373 Puckering of macula, bilateral: Secondary | ICD-10-CM | POA: Diagnosis not present

## 2022-07-03 DIAGNOSIS — H353221 Exudative age-related macular degeneration, left eye, with active choroidal neovascularization: Secondary | ICD-10-CM | POA: Diagnosis not present

## 2022-07-03 DIAGNOSIS — H43813 Vitreous degeneration, bilateral: Secondary | ICD-10-CM | POA: Diagnosis not present

## 2022-07-03 DIAGNOSIS — H35373 Puckering of macula, bilateral: Secondary | ICD-10-CM | POA: Diagnosis not present

## 2022-07-03 DIAGNOSIS — H353112 Nonexudative age-related macular degeneration, right eye, intermediate dry stage: Secondary | ICD-10-CM | POA: Diagnosis not present

## 2022-10-23 ENCOUNTER — Ambulatory Visit: Payer: Medicare PPO | Attending: Internal Medicine | Admitting: Physical Therapy

## 2022-10-23 DIAGNOSIS — R42 Dizziness and giddiness: Secondary | ICD-10-CM | POA: Diagnosis present

## 2022-10-23 DIAGNOSIS — M542 Cervicalgia: Secondary | ICD-10-CM | POA: Diagnosis present

## 2022-10-23 DIAGNOSIS — H8113 Benign paroxysmal vertigo, bilateral: Secondary | ICD-10-CM | POA: Insufficient documentation

## 2022-10-23 NOTE — Therapy (Signed)
OUTPATIENT PHYSICAL THERAPY VESTIBULAR EVALUATION     Patient Name: Wanda Long MRN: [id number] DOB:1940/07/24, 82 y.o., female Today's Date: 10/24/2022  END OF SESSION:  PT End of Session - 10/24/22 1732     Visit Number 1    Number of Visits 9    Date for PT Re-Evaluation 11/28/22   pushed out 1 week due to primary PT on vacation   Authorization Type [employer]    Authorization Time Period 10-23-22 - 12-23-22    PT Start Time 1450    PT Stop Time 1530    PT Time Calculation (min) 40 min    Activity Tolerance Other (comment)   limited by earlier treatment by opthalmologist for her MD - pt wearing sunglassses due to light sensitivity   Behavior During Therapy [facility] for tasks assessed/performed             Past Medical History:  Diagnosis Date   Thyroid disease    History reviewed. No pertinent surgical history. There are no problems to display for this patient.   PCP: Thana Ates, MD REFERRING PROVIDER: Thana Ates, MD  REFERRING DIAG: H81.10 (ICD-10-CM) - BPPV (benign paroxysmal positional vertigo)  THERAPY DIAG:  BPPV (benign paroxysmal positional vertigo), bilateral  Dizziness and giddiness  Cervicalgia  ONSET DATE: Referral date 10-17-22; onset of vertigo approx. 2 months ago  Rationale for Evaluation and Treatment: Rehabilitation  SUBJECTIVE:   SUBJECTIVE STATEMENT: Pt wearing dark sunglasses due to light sensitivity to having had treatment this morning at eye doctor for her macular degeneration; pt says she asked about rescheduling this appt but they told her this was all that was available; pt states the dizziness is mostly resolved at this time but she is having intermittent neck pain - posterior Lt side of neck - says it catches at times Pt accompanied by: self  PERTINENT HISTORY: Macular degeneration, back pain  PAIN:  Are you having pain? Yes: NPRS scale: 7-8/10 Pain location: posterior Lt cervical region Pain description:  grabbing, sharp Aggravating factors: moving my head and all of a sudden it will catch Relieving factors: Blue Emu ointment helps  PRECAUTIONS: None  WEIGHT BEARING RESTRICTIONS: No  FALLS: Has patient fallen in last 6 months? Yes. Number of falls 3 - most recent fall about a month and a half ago  LIVING ENVIRONMENT: Lives with: lives with their spouse Lives in: House/apartment  PLOF: Independent  PATIENT GOALS: get rid of the neck pain - says "the dizziness is not really a problem now"   OBJECTIVE:   DIAGNOSTIC FINDINGS: N/A  COGNITION: Overall cognitive status: Within functional limits for tasks assessed  POSTURE:  forward head and increased thoracic kyphosis  Cervical ROM:  WFL's   GAIT: Gait pattern: step through pattern Distance walked: 50' Assistive device utilized: None Level of assistance: SBA Comments: due to visual deficits   PATIENT SURVEYS:  FOTO was not captured by front office - pt unable to see well enough to complete survey at today's eval  VESTIBULAR ASSESSMENT:  GENERAL OBSERVATION: Pt is an 82 yr old lady with c/o dizziness which has now resolved within past 2 weeks and c/o neck pain intermittently; pt has macular degeneration and received a treatment earlier today and is now wearing dark sunglasses due to light sensitivity; pt is unable to tolerate lying supine due to c/o back pain and is reluctant to take sunglasses off due to light sensitivity   SYMPTOM BEHAVIOR:  Subjective history: pt reports the dizziness  has not been an issue for the past couple of weeks -says it is now more the neck pain that bothers her  Non-Vestibular symptoms:  pt has light sensitivity at today's eval due to macular degeneration  Type of dizziness: Blurred Vision and neck pain  Frequency: neck pain varies - will   Duration: pt reports no dizziness at this time  Aggravating factors: No known aggravating factors  Relieving factors: head stationary  Progression of  symptoms: better  OCULOMOTOR EXAM:  Unable to assess due to treatment for MD earlier today  VESTIBULAR - OCULAR REFLEX: unable to assess due to light sensitivity    POSITIONAL TESTING: Right Sidelying: no nystagmus Left Sidelying: no nystagmus Other: pt unable to tolerate lying supine due to c/o back pain - unable to do Dix-Hallpike testing Pt c/o blurred vision in both Rt and Lt sidelying position - states "it doesn't feel comfortable"    VESTIBULAR TREATMENT:                                                                                                   DATE: EVAL only Access Code: [email or web address] URL: [email or web address] Date: 10/24/2022 Prepared by: Maebelle Munroe  Exercises - Seated Assisted Cervical Rotation with Towel  - 1 x daily - 7 x weekly - 1 sets - 3 reps - 15-20 secs hold  PATIENT EDUCATION: Education details: [employer]  Person educated: Patient Education method: Explanation, Demonstration, and Handouts Education comprehension: verbalized understanding and returned demonstration  HOME EXERCISE PROGRAM:  see [employer] HEP above  GOALS: Goals reviewed with patient? Yes  SHORT TERM GOALS: Same as LTG's    LONG TERM GOALS: Target date: 11-21-22  Pt will report no vertigo with any bed mobility.   Baseline:  Goal status: INITIAL  2.  Pt will report decreased neck pain to </= 3/10 intensity for increased ease and comfort with cervical AROM. Baseline:  Goal status: INITIAL  3.  Pt will have (-) Rt and Lt sidelying tests with no c/o dizziness or symptoms in either test position. Baseline:  Goal status: INITIAL  4.  Independent in HEP for habituation and cervical stretches.  Baseline:  Goal status: INITIAL   ASSESSMENT:  CLINICAL IMPRESSION: Patient is a 82 y.o. lady who was seen today for physical therapy evaluation and treatment for BPPV.  Pt was unable to tolerate positional testing due to c/o back pain and was unable to tolerate removing  sunglasses due to having had treatment earlier in the day for her macular degeneration with light sensitivity reported.  Pt did report cervical pain intermittently with tenderness with palpation of posterior Lt upper trap near occiput region.  Pt reported that she had not had any dizziness in past 2 weeks - states the cervical pain is more of a problem than the dizziness at this time.  Will continue to assess and treat prn.   OBJECTIVE IMPAIRMENTS: pain and dizziness (seems to have resolved per pt report) .   ACTIVITY LIMITATIONS: locomotion level  PARTICIPATION LIMITATIONS: meal prep, cleaning, medication management, and shopping  PERSONAL FACTORS: Past/current experiences and 1-2 comorbidities:  Macular degeneration and back pain  are also affecting patient's functional outcome.   REHAB POTENTIAL: Good  CLINICAL DECISION MAKING: Stable/uncomplicated  EVALUATION COMPLEXITY: Low   PLAN:  PT FREQUENCY: 2x/week  PT DURATION: 4 weeks  PLANNED INTERVENTIONS: Therapeutic exercises, Therapeutic activity, Neuromuscular re-education, Balance training, Gait training, Patient/Family education, Self Care, Vestibular training, Canalith repositioning, Dry Needling, Electrical stimulation, and Moist heat  PLAN FOR NEXT SESSION: assess BPPV; check cervical stretch for HEP   Kary Kos, PT 10/24/2022, 5:51 PM

## 2022-10-24 ENCOUNTER — Encounter: Payer: Self-pay | Admitting: Physical Therapy

## 2022-10-28 ENCOUNTER — Encounter: Payer: Self-pay | Admitting: Physical Therapy

## 2022-10-28 ENCOUNTER — Ambulatory Visit: Payer: Medicare PPO | Admitting: Physical Therapy

## 2022-10-28 DIAGNOSIS — H8113 Benign paroxysmal vertigo, bilateral: Secondary | ICD-10-CM | POA: Diagnosis not present

## 2022-10-28 DIAGNOSIS — M542 Cervicalgia: Secondary | ICD-10-CM

## 2022-10-28 DIAGNOSIS — R42 Dizziness and giddiness: Secondary | ICD-10-CM

## 2022-10-28 NOTE — Therapy (Signed)
OUTPATIENT PHYSICAL THERAPY VESTIBULAR EVALUATION     Patient Name: Wanda Long MRN: [id number] DOB:February 21, 1941, 82 y.o., female Today's Date: 10/29/2022  END OF SESSION:  PT End of Session - 10/29/22 1714     Visit Number 2    Number of Visits 9    Date for PT Re-Evaluation 11/28/22   pushed out 1 week due to primary PT on vacation   Authorization Type [employer]    Authorization Time Period 10-23-22 - 12-23-22    PT Start Time 1145    PT Stop Time 1230    PT Time Calculation (min) 45 min    Activity Tolerance Patient tolerated treatment well    Behavior During Therapy [facility] for tasks assessed/performed              Past Medical History:  Diagnosis Date   Thyroid disease    History reviewed. No pertinent surgical history. There are no problems to display for this patient.   PCP: Thana Ates, MD REFERRING PROVIDER: Thana Ates, MD  REFERRING DIAG: H81.10 (ICD-10-CM) - BPPV (benign paroxysmal positional vertigo)  THERAPY DIAG:  Dizziness and giddiness  Cervicalgia  ONSET DATE: Referral date 10-17-22; onset of vertigo approx. 2 months ago  Rationale for Evaluation and Treatment: Rehabilitation  SUBJECTIVE:   SUBJECTIVE STATEMENT: Pt states she has not had any dizziness since she was here last Thursday: states she is doing better than she was last week - "now I can see"; still reports having the neck pain on left side of her neck intermittently  Pt accompanied by: self  PERTINENT HISTORY: Macular degeneration, back pain  PAIN:  Are you having pain? Yes: NPRS scale: 6-7/10 Pain location: posterior Lt cervical region Pain description: grabbing, sharp Aggravating factors: moving my head and all of a sudden it will catch Relieving factors: Blue Emu ointment helps  PRECAUTIONS: None  WEIGHT BEARING RESTRICTIONS: No  FALLS: Has patient fallen in last 6 months? Yes. Number of falls 3 - most recent fall about a month and a half ago  LIVING  ENVIRONMENT: Lives with: lives with their spouse Lives in: House/apartment  PLOF: Independent  PATIENT GOALS: get rid of the neck pain - says "the dizziness is not really a problem now"   OBJECTIVE:   GAIT: Gait pattern: WFL Distance walked: 48' Assistive device utilized: None Level of assistance: SBA Comments: no visual issues in today's session - pt is not wearing sunglasses today    POSITIONAL TESTING:   Rt Dix-Hallpike test (-) with no nystagmus and no c/o vertigo Lt Dix-Hallpike test (-) with no nystagmus and no c/o vertigo  TherEx:  Pt performed cervical rotation AROM 10 reps Performed lateral flexion 3 reps to each side in demonstration to add to HEP  Performed soft tissue mobilization to Lt upper trap near occiput with pt in supine position Moist heat applied to cervical region x 15" with pt in supine position  Self care:  recommended use of BioFreeze and moist heat x 15"-20" at home for management of cervical pain Access Code: [email or web address] URL: [email or web address] Date: 10/24/2022 Prepared by: Maebelle Munroe  Exercises - Seated Assisted Cervical Rotation with Towel  - 1 x daily - 7 x weekly - 1 sets - 3 reps - 15-20 secs hold  PATIENT EDUCATION: Education details: [employer];  10-28-22 -- offered dry needling modality for Lt cervical pain but pt declined this treatment modality Person educated: Patient Education method: Explanation, Demonstration, and Handouts Education comprehension: verbalized understanding and  returned demonstration  HOME EXERCISE PROGRAM:  see [employer] HEP above  GOALS: Goals reviewed with patient? Yes  SHORT TERM GOALS: Same as LTG's    LONG TERM GOALS: Target date: 11-21-22  Pt will report no vertigo with any bed mobility.   Baseline:  Goal status: INITIAL  2.  Pt will report decreased neck pain to </= 3/10 intensity for increased ease and comfort with cervical AROM. Baseline:  Goal status: INITIAL  3.  Pt will  have (-) Rt and Lt sidelying tests with no c/o dizziness or symptoms in either test position. Baseline:  Goal status: INITIAL  4.  Independent in HEP for habituation and cervical stretches.  Baseline:  Goal status: INITIAL   ASSESSMENT:  CLINICAL IMPRESSION: PT session focused on assessment of BPPV with pt having (-) Rt and Lt Dix-Hallpike tests with no nystagmus and no c/o vertigo in test positions.  Pt reports she has not had any spinning vertigo in several weeks but pt had had prior appt. With treatment at ophthalmologist's office prior to initial eval last Thursday so therefore, was unable to assess due to light sensitivity with pt wearing sunglasses during evaluation.  Pt reported moderate tenderness with palpation on Lt posterior cervical region near occiput.  Pt reported significant reduction in cervical pain after moist heat application of approx. 15" with increased Rt active cervical rotation noted.  Cont with POC.    OBJECTIVE IMPAIRMENTS: pain and dizziness (seems to have resolved per pt report) .   ACTIVITY LIMITATIONS: locomotion level  PARTICIPATION LIMITATIONS: meal prep, cleaning, medication management, and shopping  PERSONAL FACTORS: Past/current experiences and 1-2 comorbidities: Macular degeneration and back pain  are also affecting patient's functional outcome.   REHAB POTENTIAL: Good  CLINICAL DECISION MAKING: Stable/uncomplicated  EVALUATION COMPLEXITY: Low   PLAN:  PT FREQUENCY: 2x/week  PT DURATION: 4 weeks  PLANNED INTERVENTIONS: Therapeutic exercises, Therapeutic activity, Neuromuscular re-education, Balance training, Gait training, Patient/Family education, Self Care, Vestibular training, Canalith repositioning, Dry Needling, Electrical stimulation, and Moist heat  PLAN FOR NEXT SESSION: Check for BPPV:  try TENS for cervical pain, moist heat; D/C if no vertigo and cervical pain improved   Cecely Rengel, Donavan Burnet, PT 10/29/2022, 5:16 PM

## 2022-11-11 ENCOUNTER — Ambulatory Visit: Payer: PRIVATE HEALTH INSURANCE | Admitting: Physical Therapy

## 2022-12-19 DIAGNOSIS — E559 Vitamin D deficiency, unspecified: Secondary | ICD-10-CM | POA: Diagnosis not present

## 2022-12-19 DIAGNOSIS — E890 Postprocedural hypothyroidism: Secondary | ICD-10-CM | POA: Diagnosis not present

## 2022-12-19 DIAGNOSIS — M899 Disorder of bone, unspecified: Secondary | ICD-10-CM | POA: Diagnosis not present

## 2022-12-29 DIAGNOSIS — N644 Mastodynia: Secondary | ICD-10-CM | POA: Diagnosis not present

## 2022-12-29 DIAGNOSIS — L299 Pruritus, unspecified: Secondary | ICD-10-CM | POA: Diagnosis not present

## 2022-12-29 DIAGNOSIS — F5101 Primary insomnia: Secondary | ICD-10-CM | POA: Diagnosis not present

## 2022-12-29 DIAGNOSIS — W57XXXA Bitten or stung by nonvenomous insect and other nonvenomous arthropods, initial encounter: Secondary | ICD-10-CM | POA: Diagnosis not present

## 2023-01-02 DIAGNOSIS — M899 Disorder of bone, unspecified: Secondary | ICD-10-CM | POA: Diagnosis not present

## 2023-01-02 DIAGNOSIS — M8589 Other specified disorders of bone density and structure, multiple sites: Secondary | ICD-10-CM | POA: Diagnosis not present

## 2023-01-02 DIAGNOSIS — E890 Postprocedural hypothyroidism: Secondary | ICD-10-CM | POA: Diagnosis not present

## 2023-01-02 DIAGNOSIS — E559 Vitamin D deficiency, unspecified: Secondary | ICD-10-CM | POA: Diagnosis not present

## 2023-01-05 DIAGNOSIS — N644 Mastodynia: Secondary | ICD-10-CM | POA: Diagnosis not present

## 2023-01-16 ENCOUNTER — Ambulatory Visit
Admission: RE | Admit: 2023-01-16 | Discharge: 2023-01-16 | Disposition: A | Discharge status: Home / Self Care | Payer: Medicare PPO | Source: Ambulatory Visit | Attending: Internal Medicine | Admitting: Internal Medicine

## 2023-01-16 ENCOUNTER — Other Ambulatory Visit: Payer: Self-pay | Admitting: Internal Medicine

## 2023-01-16 DIAGNOSIS — M25561 Pain in right knee: Secondary | ICD-10-CM

## 2023-01-16 DIAGNOSIS — M1611 Unilateral primary osteoarthritis, right hip: Secondary | ICD-10-CM | POA: Diagnosis not present

## 2023-01-16 DIAGNOSIS — M25551 Pain in right hip: Secondary | ICD-10-CM

## 2023-01-16 DIAGNOSIS — I9589 Other hypotension: Secondary | ICD-10-CM | POA: Diagnosis not present

## 2023-02-12 DIAGNOSIS — H35373 Puckering of macula, bilateral: Secondary | ICD-10-CM | POA: Diagnosis not present

## 2023-02-12 DIAGNOSIS — H353112 Nonexudative age-related macular degeneration, right eye, intermediate dry stage: Secondary | ICD-10-CM | POA: Diagnosis not present

## 2023-02-12 DIAGNOSIS — H43813 Vitreous degeneration, bilateral: Secondary | ICD-10-CM | POA: Diagnosis not present

## 2023-02-12 DIAGNOSIS — H353221 Exudative age-related macular degeneration, left eye, with active choroidal neovascularization: Secondary | ICD-10-CM | POA: Diagnosis not present

## 2023-06-18 DIAGNOSIS — H353112 Nonexudative age-related macular degeneration, right eye, intermediate dry stage: Secondary | ICD-10-CM | POA: Diagnosis not present

## 2023-06-18 DIAGNOSIS — H43813 Vitreous degeneration, bilateral: Secondary | ICD-10-CM | POA: Diagnosis not present

## 2023-06-18 DIAGNOSIS — H353221 Exudative age-related macular degeneration, left eye, with active choroidal neovascularization: Secondary | ICD-10-CM | POA: Diagnosis not present

## 2023-06-18 DIAGNOSIS — H35373 Puckering of macula, bilateral: Secondary | ICD-10-CM | POA: Diagnosis not present

## 2023-07-22 DIAGNOSIS — H903 Sensorineural hearing loss, bilateral: Secondary | ICD-10-CM | POA: Diagnosis not present

## 2023-10-26 DIAGNOSIS — H353112 Nonexudative age-related macular degeneration, right eye, intermediate dry stage: Secondary | ICD-10-CM | POA: Diagnosis not present

## 2023-10-26 DIAGNOSIS — H35373 Puckering of macula, bilateral: Secondary | ICD-10-CM | POA: Diagnosis not present

## 2023-10-26 DIAGNOSIS — H353221 Exudative age-related macular degeneration, left eye, with active choroidal neovascularization: Secondary | ICD-10-CM | POA: Diagnosis not present

## 2023-10-26 DIAGNOSIS — H43813 Vitreous degeneration, bilateral: Secondary | ICD-10-CM | POA: Diagnosis not present

## 2024-01-04 DIAGNOSIS — E559 Vitamin D deficiency, unspecified: Secondary | ICD-10-CM | POA: Diagnosis not present

## 2024-01-04 DIAGNOSIS — M899 Disorder of bone, unspecified: Secondary | ICD-10-CM | POA: Diagnosis not present

## 2024-01-04 DIAGNOSIS — E890 Postprocedural hypothyroidism: Secondary | ICD-10-CM | POA: Diagnosis not present

## 2024-01-06 DIAGNOSIS — Z1231 Encounter for screening mammogram for malignant neoplasm of breast: Secondary | ICD-10-CM | POA: Diagnosis not present

## 2024-01-11 DIAGNOSIS — E559 Vitamin D deficiency, unspecified: Secondary | ICD-10-CM | POA: Diagnosis not present

## 2024-01-11 DIAGNOSIS — E890 Postprocedural hypothyroidism: Secondary | ICD-10-CM | POA: Diagnosis not present

## 2024-01-11 DIAGNOSIS — M899 Disorder of bone, unspecified: Secondary | ICD-10-CM | POA: Diagnosis not present

## 2024-02-08 DIAGNOSIS — H353220 Exudative age-related macular degeneration, left eye, stage unspecified: Secondary | ICD-10-CM | POA: Diagnosis not present

## 2024-02-29 DIAGNOSIS — H43813 Vitreous degeneration, bilateral: Secondary | ICD-10-CM | POA: Diagnosis not present

## 2024-02-29 DIAGNOSIS — H353221 Exudative age-related macular degeneration, left eye, with active choroidal neovascularization: Secondary | ICD-10-CM | POA: Diagnosis not present

## 2024-02-29 DIAGNOSIS — H353112 Nonexudative age-related macular degeneration, right eye, intermediate dry stage: Secondary | ICD-10-CM | POA: Diagnosis not present

## 2024-02-29 DIAGNOSIS — H35373 Puckering of macula, bilateral: Secondary | ICD-10-CM | POA: Diagnosis not present

## 2024-04-12 DIAGNOSIS — Z0000 Encounter for general adult medical examination without abnormal findings: Secondary | ICD-10-CM | POA: Diagnosis not present

## 2024-04-12 DIAGNOSIS — E7800 Pure hypercholesterolemia, unspecified: Secondary | ICD-10-CM | POA: Diagnosis not present

## 2024-04-12 DIAGNOSIS — R2689 Other abnormalities of gait and mobility: Secondary | ICD-10-CM | POA: Diagnosis not present

## 2024-04-12 DIAGNOSIS — H353221 Exudative age-related macular degeneration, left eye, with active choroidal neovascularization: Secondary | ICD-10-CM | POA: Diagnosis not present

## 2024-04-12 DIAGNOSIS — M810 Age-related osteoporosis without current pathological fracture: Secondary | ICD-10-CM | POA: Diagnosis not present

## 2024-04-12 DIAGNOSIS — Z23 Encounter for immunization: Secondary | ICD-10-CM | POA: Diagnosis not present

## 2024-04-12 DIAGNOSIS — G252 Other specified forms of tremor: Secondary | ICD-10-CM | POA: Diagnosis not present

## 2024-04-12 DIAGNOSIS — Z79899 Other long term (current) drug therapy: Secondary | ICD-10-CM | POA: Diagnosis not present

## 2024-04-12 DIAGNOSIS — Z1331 Encounter for screening for depression: Secondary | ICD-10-CM | POA: Diagnosis not present

## 2024-04-12 DIAGNOSIS — E039 Hypothyroidism, unspecified: Secondary | ICD-10-CM | POA: Diagnosis not present

## 2024-04-12 DIAGNOSIS — R296 Repeated falls: Secondary | ICD-10-CM | POA: Diagnosis not present

## 2024-04-18 ENCOUNTER — Ambulatory Visit: Admitting: Physical Therapy

## 2024-04-18 NOTE — Therapy (Incomplete)
 OUTPATIENT PHYSICAL THERAPY NEURO EVALUATION   Patient Name: Wanda Long MRN: [id number] DOB:October 22, 1940, 83 y.o., female Today's Date: 04/18/2024   PCP: Roseann Coad, MD REFERRING PROVIDER: Dwight Trula SQUIBB, MD  END OF SESSION:   Past Medical History:  Diagnosis Date   Thyroid  disease    No past surgical history on file. There are no active problems to display for this patient.   ONSET DATE: 04/12/2024 (referral)  REFERRING DIAG: R29.6 (ICD-10-CM) - Repeated falls  THERAPY DIAG:  No diagnosis found.  Rationale for Evaluation and Treatment: Rehabilitation  SUBJECTIVE:                                                                                                                                                                                             SUBJECTIVE STATEMENT: *** Pt accompanied by: {accompnied:27141}  PERTINENT HISTORY: From Dr. Janette note on 04/13/24:  Patient reports 3 falls in the last 6 weeks (August 23rd, September 26th, and October 3rd), with a history of approximately 8 falls over the past 2-3 years. The most recent fall on October 3rd resulted in arm and back injuries. Patient attributes falls to vision issues related to macular degeneration and recent changes in glasses prescription. However, given the frequency and pattern of falls, there is concern for underlying balance issues or possible neurological conditions such as Parkinson's disease.  PAIN:  Are you having pain? {OPRCPAIN:27236}  PRECAUTIONS: {Therapy precautions:24002}  RED FLAGS: {PT Red Flags:29287}   WEIGHT BEARING RESTRICTIONS: {Yes ***/No:24003}  FALLS: Has patient fallen in last 6 months? {fallsyesno:27318}  LIVING ENVIRONMENT: Lives with: {OPRC lives with:25569::lives with their family} Lives in: {Lives in:25570} Stairs: {opstairs:27293} Has following equipment at home: {Assistive devices:23999}  PLOF: {PLOF:24004}  PATIENT GOALS: ***  OBJECTIVE:  Note:  Objective measures were completed at Evaluation unless otherwise noted.  DIAGNOSTIC FINDINGS: ***  COGNITION: Overall cognitive status: {cognition:24006}   SENSATION: {sensation:27233}  COORDINATION: ***  EDEMA:  {edema:24020}  MUSCLE TONE: {LE tone:25568}  MUSCLE LENGTH: Hamstrings: Right *** deg; Left *** deg Debby test: Right *** deg; Left *** deg  DTRs:  {DTR SITE:24025}  POSTURE: {posture:25561}  LOWER EXTREMITY ROM:     {AROM/PROM:27142}  Right Eval Left Eval  Hip flexion    Hip extension    Hip abduction    Hip adduction    Hip internal rotation    Hip external rotation    Knee flexion    Knee extension    Ankle dorsiflexion    Ankle plantarflexion    Ankle inversion    Ankle eversion     (Blank rows = not tested)  LOWER  EXTREMITY MMT:    MMT Right Eval Left Eval  Hip flexion    Hip extension    Hip abduction    Hip adduction    Hip internal rotation    Hip external rotation    Knee flexion    Knee extension    Ankle dorsiflexion    Ankle plantarflexion    Ankle inversion    Ankle eversion    (Blank rows = not tested)  BED MOBILITY:  {bed mobility:32615:p}  TRANSFERS: {transfers eval:32620}  RAMP:  {ramp eval:32616}  CURB:  {curb eval:32617}  STAIRS: {stairs eval:32618} GAIT: Findings: {GaitneuroPT:32644::Distance walked: ***,Comments: ***}  FUNCTIONAL TESTS:  {Functional tests:24029}  PATIENT SURVEYS:  {rehab surveys:24030}                                                                                                                              TREATMENT DATE: ***    PATIENT EDUCATION: Education details: *** Person educated: {Person educated:25204} Education method: {Education Method:25205} Education comprehension: {Education Comprehension:25206}  HOME EXERCISE PROGRAM: ***  GOALS: Goals reviewed with patient? Yes  SHORT TERM GOALS: Target date: ***  *** Baseline: Goal status: INITIAL  2.   *** Baseline:  Goal status: INITIAL  3.  *** Baseline:  Goal status: INITIAL  4.  *** Baseline:  Goal status: INITIAL  5.  *** Baseline:  Goal status: INITIAL  6.  *** Baseline:  Goal status: INITIAL  LONG TERM GOALS: Target date: ***  *** Baseline:  Goal status: INITIAL  2.  *** Baseline:  Goal status: INITIAL  3.  *** Baseline:  Goal status: INITIAL  4.  *** Baseline:  Goal status: INITIAL  5.  *** Baseline:  Goal status: INITIAL  6.  *** Baseline:  Goal status: INITIAL  ASSESSMENT:  CLINICAL IMPRESSION: Patient is a 83 year old female referred to Neuro OPPT for repeated falls. Pt's PMH is significant for: Macular degeneration. The following deficits were present during the exam: ***. Based on ***, pt is an incr risk for falls. Pt would benefit from skilled PT to address these impairments and functional limitations to maximize functional mobility independence   OBJECTIVE IMPAIRMENTS: {opptimpairments:25111}.   ACTIVITY LIMITATIONS: {activitylimitations:27494}  PARTICIPATION LIMITATIONS: {participationrestrictions:25113}  PERSONAL FACTORS: {Personal factors:25162} are also affecting patient's functional outcome.   REHAB POTENTIAL: {rehabpotential:25112}  CLINICAL DECISION MAKING: {clinical decision making:25114}  EVALUATION COMPLEXITY: {Evaluation complexity:25115}  PLAN:  PT FREQUENCY: {rehab frequency:25116}  PT DURATION: {rehab duration:25117}  PLANNED INTERVENTIONS: {rehab planned interventions:25118::97110-Therapeutic exercises,97530- Therapeutic [phone]- Neuromuscular re-education,97535- Self Rjmz,02859- Manual therapy,Patient/Family education}  PLAN FOR NEXT SESSION: ***   Opie Maclaughlin E Harmonie Verrastro, PT, DPT 04/18/2024, 8:14 AM

## 2024-04-21 ENCOUNTER — Encounter: Payer: Self-pay | Admitting: Neurology

## 2024-04-27 ENCOUNTER — Ambulatory Visit: Attending: Internal Medicine | Admitting: Physical Therapy

## 2024-04-27 ENCOUNTER — Telehealth: Payer: Self-pay | Admitting: Physical Therapy

## 2024-04-27 DIAGNOSIS — R2681 Unsteadiness on feet: Secondary | ICD-10-CM | POA: Insufficient documentation

## 2024-04-27 DIAGNOSIS — R296 Repeated falls: Secondary | ICD-10-CM | POA: Insufficient documentation
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
## 2024-04-27 NOTE — Telephone Encounter (Signed)
 [facility] and left VM for Dr. Janette CMA regarding order for HHPT. Left office call back number and requested call back to confirm order had been placed.   Breniyah Romm E Dicky Boer, PT, DPT

## 2024-04-28 DIAGNOSIS — M545 Low back pain, unspecified: Secondary | ICD-10-CM | POA: Diagnosis not present

## 2024-04-28 DIAGNOSIS — M25551 Pain in right hip: Secondary | ICD-10-CM | POA: Diagnosis not present
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
## 2024-05-03 ENCOUNTER — Encounter: Payer: Self-pay | Admitting: Neurology

## 2024-05-03 ENCOUNTER — Ambulatory Visit: Admitting: Neurology

## 2024-05-03 VITALS — BP 128/84 | HR 74 | Resp 18 | Wt 162.0 lb

## 2024-05-03 DIAGNOSIS — I499 Cardiac arrhythmia, unspecified: Secondary | ICD-10-CM | POA: Diagnosis not present

## 2024-05-03 DIAGNOSIS — G20A1 Parkinson's disease without dyskinesia, without mention of fluctuations: Secondary | ICD-10-CM | POA: Diagnosis not present

## 2024-05-03 NOTE — Patient Instructions (Signed)
 Start Carbidopa Levodopa as follows: Take 1/2 tablet three times daily, at least 30 minutes before meals (approximately 8am/11am/4pm), for one week Then take 1/2 tablet in the morning, 1/2 tablet at noon, 1 tablet at 4pm, at least 30 minutes before meals, for one week Then take 1/2 tablet in the morning, 1 tablet in the afternoon at noon, 1 tablet in the evening at 4pm, at least 30 minutes before meals, for one week Then take 1 tablet three times daily at 8am/11am/4pm, at least 30 minutes before meals   As a reminder, carbidopa/levodopa can be taken at the same time as a carbohydrate, but we like to have you take your pill either 30 minutes before a protein source or 1 hour after as protein can interfere with carbidopa/levodopa absorption.

## 2024-05-04 ENCOUNTER — Telehealth: Payer: Self-pay | Admitting: Neurology

## 2024-05-04 ENCOUNTER — Other Ambulatory Visit: Payer: Self-pay

## 2024-05-04 DIAGNOSIS — G20A1 Parkinson's disease without dyskinesia, without mention of fluctuations: Secondary | ICD-10-CM

## 2024-05-04 DIAGNOSIS — R498 Other voice and resonance disorders: Secondary | ICD-10-CM

## 2024-05-04 DIAGNOSIS — E039 Hypothyroidism, unspecified: Secondary | ICD-10-CM | POA: Diagnosis not present

## 2024-05-04 DIAGNOSIS — I4891 Unspecified atrial fibrillation: Secondary | ICD-10-CM | POA: Diagnosis not present

## 2024-05-04 DIAGNOSIS — I499 Cardiac arrhythmia, unspecified: Secondary | ICD-10-CM | POA: Diagnosis not present

## 2024-05-04 NOTE — Telephone Encounter (Signed)
 Just verifying this is correct to send for this patient

## 2024-05-04 NOTE — Telephone Encounter (Signed)
 Pt does not show script for Rx Carbidopa-Levodopa [id number] [place], [place] - [address]

## 2024-05-05 MED ORDER — CARBIDOPA-LEVODOPA 25-100 MG PO TABS: ORAL_TABLET | ORAL | 1 refills | Status: AC

## 2024-05-05 NOTE — Telephone Encounter (Signed)
 Called patient left voicemail sent in rX with correct dosage from Dr. Evonnie

## 2024-05-06 DIAGNOSIS — I4891 Unspecified atrial fibrillation: Secondary | ICD-10-CM | POA: Diagnosis not present

## 2024-05-06 DIAGNOSIS — Z9181 History of falling: Secondary | ICD-10-CM | POA: Diagnosis not present

## 2024-05-06 DIAGNOSIS — Z556 Problems related to health literacy: Secondary | ICD-10-CM | POA: Diagnosis not present

## 2024-05-06 DIAGNOSIS — G20A1 Parkinson's disease without dyskinesia, without mention of fluctuations: Secondary | ICD-10-CM | POA: Diagnosis not present

## 2024-05-06 DIAGNOSIS — E039 Hypothyroidism, unspecified: Secondary | ICD-10-CM | POA: Diagnosis not present

## 2024-05-06 DIAGNOSIS — Z604 Social exclusion and rejection: Secondary | ICD-10-CM | POA: Diagnosis not present

## 2024-05-06 DIAGNOSIS — R491 Aphonia: Secondary | ICD-10-CM | POA: Diagnosis not present

## 2024-05-06 DIAGNOSIS — I499 Cardiac arrhythmia, unspecified: Secondary | ICD-10-CM | POA: Diagnosis not present

## 2024-05-09 ENCOUNTER — Ambulatory Visit (HOSPITAL_COMMUNITY): Admission: RE | Admit: 2024-05-09 | Source: Ambulatory Visit | Admitting: Internal Medicine

## 2024-05-09 NOTE — Progress Notes (Incomplete)
    Primary Care Physician: Dwight Trula SQUIBB, MD Primary Cardiologist: None Electrophysiologist: None  {Click to update primary MD,subspecialty MD or APP then REFRESH:1}   Referring Physician: PCP  {Removed FMH, PMH, PSH, ALLERGY, CMED, and SOC :1}   Wanda Long is a 83 y.o. female with a history of macular degeneration, hypothyroidism, and Parkinson disease who presents for consultation in the [facility]. Seen by Neurology on 10/28 for Parkinson disease and noted to have cardiac arrhythmia; no ECG done. Seen by PCP on 10/29 and started on Lopressor 25 mg BID. Not started on OAC due to recent diagnosis of Parkinson disease and unsteadiness. History of falls this year per review of records. Patient is on ASA for stroke prevention.  On evaluation today, patient is currently in ***.   Today, she denies symptoms of ***palpitations, chest pain, shortness of breath, orthopnea, PND, lower extremity edema, dizziness, presyncope, syncope, bleeding, or neurologic sequela. The patient is tolerating medications without difficulties and is otherwise without complaint today.   she has a BMI of There is no height or weight on file to calculate BMI.. There were no vitals filed for this visit.  Current Outpatient Medications  Medication Sig Dispense Refill   aspirin  EC 81 MG tablet Take 81 mg by mouth daily as needed for mild pain. Swallow whole.     calcium carbonate (OS-CAL) 600 MG TABS tablet Take 600 mg by mouth 2 (two) times daily with a meal.     carbidopa-levodopa (SINEMET IR) 25-100 MG tablet 1 tablet at 8am, noon, 4pm 270 tablet 1   Cholecalciferol (VITAMIN D3) 25 MCG (1000 UT) CAPS Take 2,000 Units by mouth daily.     HYDROcodone -acetaminophen  (NORCO/VICODIN) 5-325 MG tablet Take 0.5-1 tablets by mouth every 6 (six) hours as needed for severe pain. (Patient not taking: Reported on 05/03/2024) 6 tablet 0   Multiple Vitamins-Minerals (PRESERVISION AREDS 2+MULTI VIT  PO) Take 1 tablet by mouth in the morning and at bedtime.     SYNTHROID 75 MCG tablet Take 75 mcg by mouth every morning.     No current facility-administered medications for this visit.    Atrial Fibrillation Management history:  Previous antiarrhythmic drugs: none Previous cardioversions: none Previous ablations: none Anticoagulation history: none   ROS- All systems are reviewed and negative except as per the HPI above.  Physical Exam: There were no vitals taken for this visit.  GEN: Well nourished, well developed in no acute distress NECK: No JVD; No carotid bruits CARDIAC: {EPRHYTHM:28826}, no murmurs, rubs, gallops RESPIRATORY:  Clear to auscultation without rales, wheezing or rhonchi  ABDOMEN: Soft, non-tender, non-distended EXTREMITIES:  No edema; No deformity   EKG today demonstrates ***   ASSESSMENT & PLAN CHA2DS2-VASc Score =    The patient's score is based upon:   {Click here to calculate score.  REFRESH note before signing. :1}     ASSESSMENT AND PLAN: {Select the correct AFib Diagnosis                 :[id number]}  Cardiac arrhythmia  Chads 3   Follow up ***   Terra Pac, [facility] [address] [place], [place] [address] [phone]

## 2024-05-11 ENCOUNTER — Encounter (HOSPITAL_COMMUNITY): Payer: Self-pay

## 2024-05-11 DIAGNOSIS — Z604 Social exclusion and rejection: Secondary | ICD-10-CM | POA: Diagnosis not present

## 2024-05-11 DIAGNOSIS — E039 Hypothyroidism, unspecified: Secondary | ICD-10-CM | POA: Diagnosis not present

## 2024-05-11 DIAGNOSIS — R491 Aphonia: Secondary | ICD-10-CM | POA: Diagnosis not present

## 2024-05-11 DIAGNOSIS — I4891 Unspecified atrial fibrillation: Secondary | ICD-10-CM | POA: Diagnosis not present

## 2024-05-11 DIAGNOSIS — Z556 Problems related to health literacy: Secondary | ICD-10-CM | POA: Diagnosis not present

## 2024-05-11 DIAGNOSIS — Z9181 History of falling: Secondary | ICD-10-CM | POA: Diagnosis not present

## 2024-05-11 DIAGNOSIS — G20A1 Parkinson's disease without dyskinesia, without mention of fluctuations: Secondary | ICD-10-CM | POA: Diagnosis not present

## 2024-05-11 DIAGNOSIS — I499 Cardiac arrhythmia, unspecified: Secondary | ICD-10-CM | POA: Diagnosis not present

## 2024-05-18 ENCOUNTER — Encounter (HOSPITAL_COMMUNITY): Payer: Self-pay | Admitting: Internal Medicine

## 2024-05-18 ENCOUNTER — Other Ambulatory Visit (HOSPITAL_COMMUNITY): Payer: Self-pay

## 2024-05-18 ENCOUNTER — Telehealth (HOSPITAL_COMMUNITY): Payer: Self-pay

## 2024-05-18 ENCOUNTER — Ambulatory Visit (HOSPITAL_COMMUNITY)
Admission: RE | Admit: 2024-05-18 | Discharge: 2024-05-18 | Disposition: A | Discharge status: Home / Self Care | Source: Ambulatory Visit | Attending: Internal Medicine | Admitting: Internal Medicine

## 2024-05-18 VITALS — BP 102/72 | HR 103 | Ht 67.5 in | Wt 162.0 lb

## 2024-05-18 DIAGNOSIS — I499 Cardiac arrhythmia, unspecified: Secondary | ICD-10-CM

## 2024-05-18 DIAGNOSIS — D6869 Other thrombophilia: Secondary | ICD-10-CM

## 2024-05-18 DIAGNOSIS — I480 Paroxysmal atrial fibrillation: Secondary | ICD-10-CM | POA: Diagnosis not present

## 2024-05-18 NOTE — Progress Notes (Addendum)
 Primary Care Physician: Dwight Trula SQUIBB, MD Primary Cardiologist: None Electrophysiologist: None     Referring Physician: Dr. Dwight Ramus Jayel Scaduto is a 83 y.o. female with a history of idiopathic Parkinson disease, gait instability, rest tremor, and atrial fibrillation who presents for consultation in the [facility]. Recent neurologist visit appreciate irregular heart rhythm on physical exam. No ECG performed.   On evaluation today, Wanda Long is currently in Afib. Wanda Long is not taking a daily aspirin  at this time. Wanda Long notes Wanda Long has fallen multiple times this year, possibly 3-4 times. Wanda Long is taking metoprolol as prescribed by her PCP.  Today, Wanda Long denies symptoms of palpitations, chest pain, shortness of breath, orthopnea, PND, lower extremity edema, dizziness, presyncope, syncope, bleeding, or neurologic sequela. The Wanda Long is tolerating medications without difficulties and is otherwise without complaint today.    Wanda Long has a BMI of Body mass index is 25 kg/m.SABRA Filed Weights   05/18/24 1032  Weight: 73.5 kg    Current Outpatient Medications  Medication Sig Dispense Refill   aspirin  EC 81 MG tablet Take 81 mg by mouth daily as needed for mild pain. Swallow whole.     calcium carbonate (OS-CAL) 600 MG TABS tablet Take 600 mg by mouth 2 (two) times daily with a meal.     carbidopa-levodopa (SINEMET IR) 25-100 MG tablet 1 tablet at 8am, noon, 4pm 270 tablet 1   Cholecalciferol (VITAMIN D3) 25 MCG (1000 UT) CAPS Take 2,000 Units by mouth daily.     Multiple Vitamins-Minerals (PRESERVISION AREDS 2+MULTI VIT PO) Take 1 tablet by mouth in the morning and at bedtime.     SYNTHROID 75 MCG tablet Take 75 mcg by mouth every morning.     No current facility-administered medications for this encounter.    Atrial Fibrillation Management history:  Previous antiarrhythmic drugs: none Previous cardioversions: none Previous ablations: none Anticoagulation  history: none   ROS- All systems are reviewed and negative except as per the HPI above.  Physical Exam: BP 102/72   Pulse (!) 103   Ht 5' 7.5 (1.715 m)   Wt 73.5 kg   BMI 25.00 kg/m   GEN: Well nourished, well developed in no acute distress NECK: No JVD; No carotid bruits CARDIAC: Irregularly irregular rate and rhythm, no murmurs, rubs, gallops RESPIRATORY:  Clear to auscultation without rales, wheezing or rhonchi  ABDOMEN: Soft, non-tender, non-distended EXTREMITIES:  No edema; No deformity   EKG today demonstrates  Vent. rate 103 BPM PR interval * ms QRS duration 70 ms QT/QTcB 368/482 ms P-R-T axes * 69 42 Atrial fibrillation with rapid ventricular response Cannot rule out Anterior infarct , age undetermined Abnormal ECG No previous ECGs available  ASSESSMENT & PLAN CHA2DS2-VASc Score = 3  The Wanda Long's score is based upon: CHF History: 0 HTN History: 0 Diabetes History: 0 Stroke History: 0 Vascular Disease History: 0 Age Score: 2 Gender Score: 1       ASSESSMENT AND PLAN: Paroxysmal Atrial Fibrillation (ICD10:  I48.0) The Wanda Long's CHA2DS2-VASc score is 3, indicating a 3.2% annual risk of stroke.    Wanda Long is currently in Afib. Education provided about Afib. Discussion about anticoagulation below. Review of outside PCP records show creatinine 0.87, K 4.2 Hgb 13.1 PLT 206 from labs on 04/13/24.  Secondary Hypercoagulable State (ICD10:  D68.69) The Wanda Long is at significant risk for stroke/thromboembolism based upon her CHA2DS2-VASc Score of 3.   We discussed the reasoning behind anticoagulation in the  setting of stroke prevention related to Afib. We discussed the benefits vs risks of anticoagulation. We specifically discussed the risk of stroke versus the risk of bleeding. Wanda Long may have an increased risk of bleeding due to history of multiple falls this year. After discussion, Wanda Long will discuss whether or not to begin anticoagulation with her family and  contact clinic with decision. If Wanda Long agrees to take the anticoagulant despite the risk of bleeding, would be eligible for Eliquis 5 mg BID. This dosage is based on weight > 60 kg and creatinine below 1.5.     Follow up Afib clinic prn.   Terra Pac, [facility]  Afib Clinic [address] [place], [place] [address] [phone]

## 2024-05-18 NOTE — Telephone Encounter (Signed)
 Pharmacy Patient Advocate Encounter  Insurance verification completed.    The patient is insured through [employer]. Patient has Medicare and is not eligible for a copay card, but may be able to apply for patient assistance or Medicare RX Payment Plan (Patient Must reach out to their plan, if eligible for payment plan), if available.    Ran test claim for Eliquis 5mg  and the current 30 day co-pay is $40.   This test claim was processed through [facility]- copay amounts may vary at other pharmacies due to [employer], or as the patient moves through the different stages of their insurance plan.

## 2024-05-19 DIAGNOSIS — E039 Hypothyroidism, unspecified: Secondary | ICD-10-CM | POA: Diagnosis not present

## 2024-05-19 DIAGNOSIS — R491 Aphonia: Secondary | ICD-10-CM | POA: Diagnosis not present

## 2024-05-19 DIAGNOSIS — I4891 Unspecified atrial fibrillation: Secondary | ICD-10-CM | POA: Diagnosis not present

## 2024-05-19 DIAGNOSIS — I499 Cardiac arrhythmia, unspecified: Secondary | ICD-10-CM | POA: Diagnosis not present

## 2024-05-19 DIAGNOSIS — Z604 Social exclusion and rejection: Secondary | ICD-10-CM | POA: Diagnosis not present

## 2024-05-19 DIAGNOSIS — Z556 Problems related to health literacy: Secondary | ICD-10-CM | POA: Diagnosis not present

## 2024-05-19 DIAGNOSIS — G20A1 Parkinson's disease without dyskinesia, without mention of fluctuations: Secondary | ICD-10-CM | POA: Diagnosis not present

## 2024-05-31 ENCOUNTER — Ambulatory Visit: Admitting: Neurology

## 2024-05-31 DIAGNOSIS — W1839XA Other fall on same level, initial encounter: Secondary | ICD-10-CM | POA: Diagnosis not present

## 2024-05-31 DIAGNOSIS — S51822A Laceration with foreign body of left forearm, initial encounter: Secondary | ICD-10-CM | POA: Diagnosis not present

## 2024-06-01 DIAGNOSIS — Z604 Social exclusion and rejection: Secondary | ICD-10-CM | POA: Diagnosis not present

## 2024-06-01 DIAGNOSIS — Z9181 History of falling: Secondary | ICD-10-CM | POA: Diagnosis not present

## 2024-06-01 DIAGNOSIS — I499 Cardiac arrhythmia, unspecified: Secondary | ICD-10-CM | POA: Diagnosis not present

## 2024-06-01 DIAGNOSIS — G20A1 Parkinson's disease without dyskinesia, without mention of fluctuations: Secondary | ICD-10-CM | POA: Diagnosis not present

## 2024-06-01 DIAGNOSIS — R491 Aphonia: Secondary | ICD-10-CM | POA: Diagnosis not present

## 2024-06-01 DIAGNOSIS — E039 Hypothyroidism, unspecified: Secondary | ICD-10-CM | POA: Diagnosis not present

## 2024-06-01 DIAGNOSIS — Z556 Problems related to health literacy: Secondary | ICD-10-CM | POA: Diagnosis not present

## 2024-06-08 ENCOUNTER — Ambulatory Visit: Admitting: Neurology

## 2024-11-01 ENCOUNTER — Ambulatory Visit: Admitting: Neurology
